# Patient Record
Sex: Female | Born: 1975 | Race: White | Hispanic: No | Marital: Married | Smoking: Former smoker
Health system: Southern US, Community
[De-identification: ages and names within clinical notes are randomized; demographics above are authoritative.]

## PROBLEM LIST (undated history)

## (undated) DIAGNOSIS — R011 Cardiac murmur, unspecified: Secondary | ICD-10-CM

## (undated) DIAGNOSIS — I341 Nonrheumatic mitral (valve) prolapse: Secondary | ICD-10-CM

## (undated) DIAGNOSIS — G8220 Paraplegia, unspecified: Secondary | ICD-10-CM

## (undated) DIAGNOSIS — F419 Anxiety disorder, unspecified: Secondary | ICD-10-CM

## (undated) DIAGNOSIS — I350 Nonrheumatic aortic (valve) stenosis: Secondary | ICD-10-CM

## (undated) HISTORY — PX: TUBAL LIGATION: SHX77

## (undated) HISTORY — PX: AORTA SURGERY: SHX548

---

## 2009-03-19 ENCOUNTER — Emergency Department (HOSPITAL_BASED_OUTPATIENT_CLINIC_OR_DEPARTMENT_OTHER): Admission: EM | Admit: 2009-03-19 | Discharge: 2009-03-19 | Payer: Self-pay | Admitting: Emergency Medicine

## 2009-03-19 ENCOUNTER — Ambulatory Visit: Payer: Self-pay | Admitting: Diagnostic Radiology

## 2009-12-12 ENCOUNTER — Ambulatory Visit: Payer: Self-pay | Admitting: Cardiovascular Disease

## 2009-12-20 ENCOUNTER — Encounter: Admission: RE | Admit: 2009-12-20 | Discharge: 2009-12-20 | Payer: Self-pay | Admitting: Cardiovascular Disease

## 2009-12-25 ENCOUNTER — Encounter: Payer: Self-pay | Admitting: Cardiovascular Disease

## 2009-12-25 ENCOUNTER — Ambulatory Visit: Payer: Self-pay | Admitting: Cardiovascular Disease

## 2009-12-25 ENCOUNTER — Ambulatory Visit (HOSPITAL_COMMUNITY): Admission: RE | Admit: 2009-12-25 | Discharge: 2009-12-25 | Payer: Self-pay | Admitting: Cardiovascular Disease

## 2010-07-01 LAB — DIFFERENTIAL
Basophils Absolute: 0 10*3/uL (ref 0.0–0.1)
Basophils Relative: 1 % (ref 0–1)
Lymphocytes Relative: 21 % (ref 12–46)
Monocytes Absolute: 0.3 10*3/uL (ref 0.1–1.0)
Monocytes Relative: 6 % (ref 3–12)
Neutro Abs: 3.5 10*3/uL (ref 1.7–7.7)
Neutrophils Relative %: 72 % (ref 43–77)

## 2010-07-01 LAB — COMPREHENSIVE METABOLIC PANEL
Albumin: 4.6 g/dL (ref 3.5–5.2)
Alkaline Phosphatase: 57 U/L (ref 39–117)
BUN: 8 mg/dL (ref 6–23)
Creatinine, Ser: 0.4 mg/dL (ref 0.4–1.2)
Glucose, Bld: 93 mg/dL (ref 70–99)
Total Protein: 7.5 g/dL (ref 6.0–8.3)

## 2010-07-01 LAB — URINALYSIS, ROUTINE W REFLEX MICROSCOPIC
Glucose, UA: NEGATIVE mg/dL
Ketones, ur: NEGATIVE mg/dL
Nitrite: NEGATIVE
Protein, ur: NEGATIVE mg/dL
pH: 7.5 (ref 5.0–8.0)

## 2010-07-01 LAB — CBC
HCT: 37.4 % (ref 36.0–46.0)
Hemoglobin: 13.1 g/dL (ref 12.0–15.0)
MCHC: 35.1 g/dL (ref 30.0–36.0)
MCV: 94.3 fL (ref 78.0–100.0)
Platelets: 197 10*3/uL (ref 150–400)
RDW: 12.4 % (ref 11.5–15.5)

## 2010-07-01 LAB — PREGNANCY, URINE: Preg Test, Ur: NEGATIVE

## 2010-12-21 IMAGING — CT CT ANGIO CHEST
2 of 6 series · 10 of 30 positions shown · IV contrast (agent unspecified)
Comparison: Chest x-ray of 03/19/2009

CTA CHEST

CLINICAL DATA: History of aortic valve surgery, now with chest
pain and upper mid abdominal pain

CT ANGIOGRAPHY CHEST AND ABDOMEN
TECHNIQUE: Multidetector CT imaging of the chest and abdomen was
performed using the standard protocol during bolus administration
of intravenous contrast.  Multiplanar CT image reconstructions
including MIPs were obtained to evaluate the vascular anatomy.
Contrast: 100 ml Umnipaque-URR

[Series 5: angio · axial · 0.70mm/px · z∈[-358,-28]mm · 6 of 186 slices shown]
[im 27/186  lung]
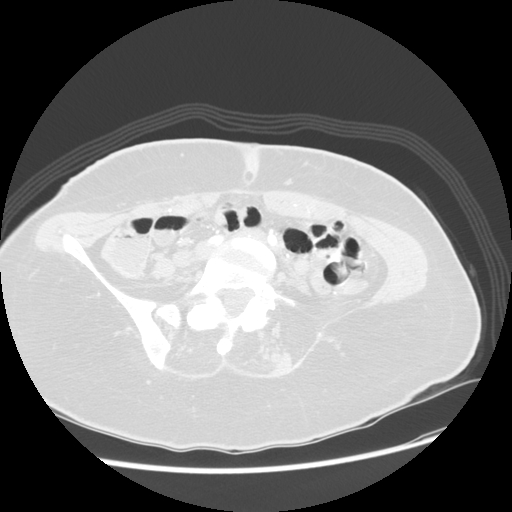
[im 53/186  mediastinal]
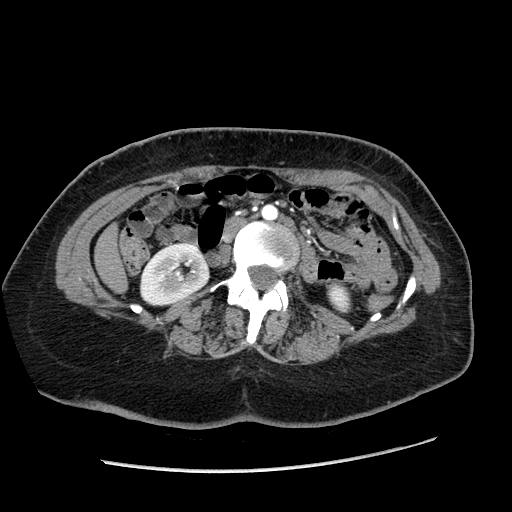
[im 80/186  lung]
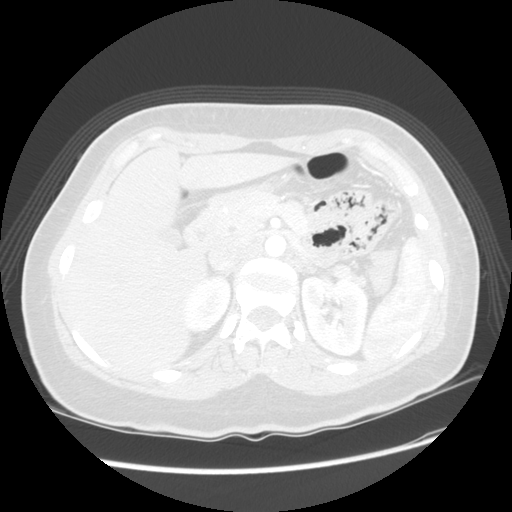
[im 106/186  mediastinal]
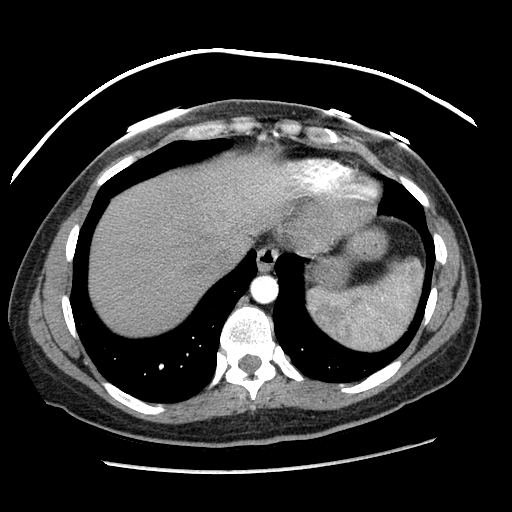
[im 133/186  lung]
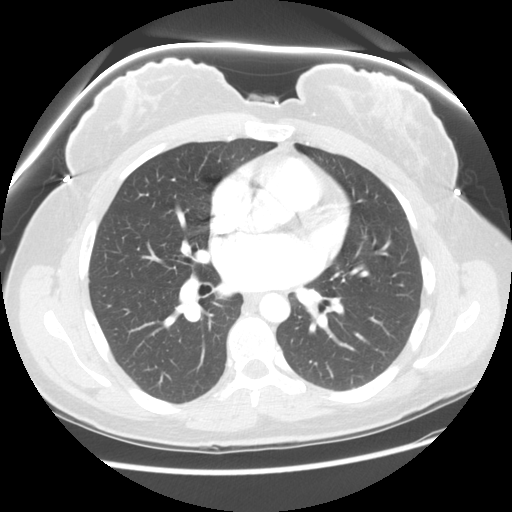
[im 159/186  mediastinal]
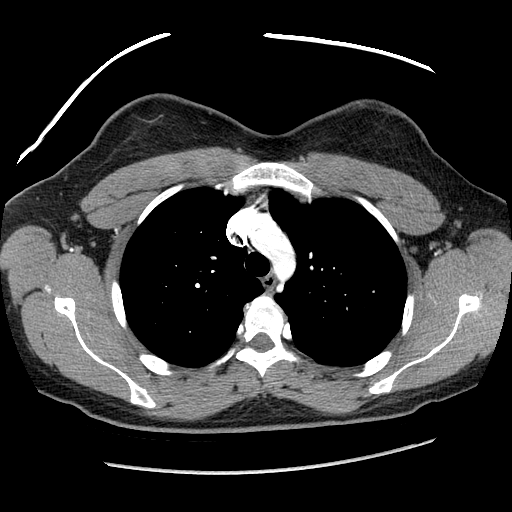

[Series 602: sagittal body · sagittal · 0.90mm/px · 4 of 145 slices shown]
[im 29/145  lung]
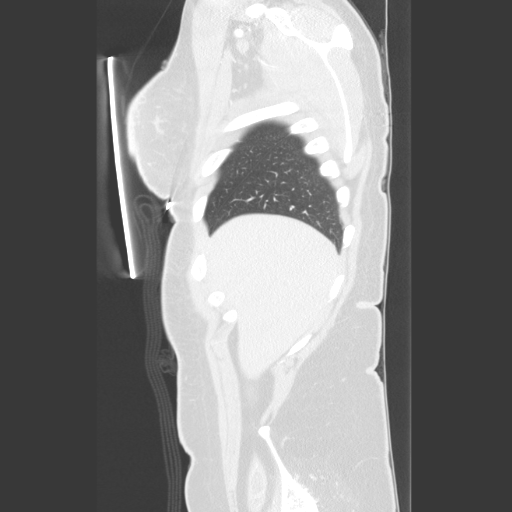
[im 58/145  lung]
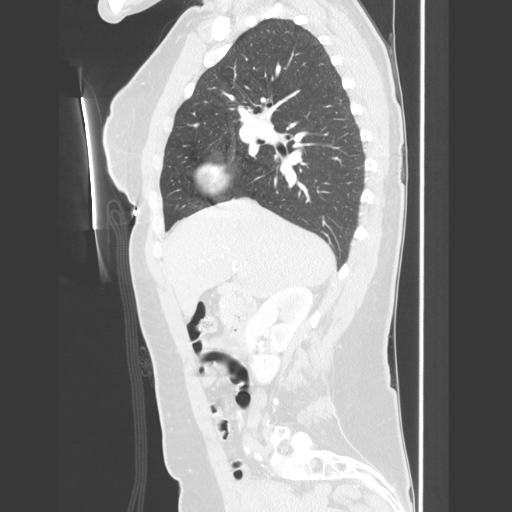
[im 87/145  lung]
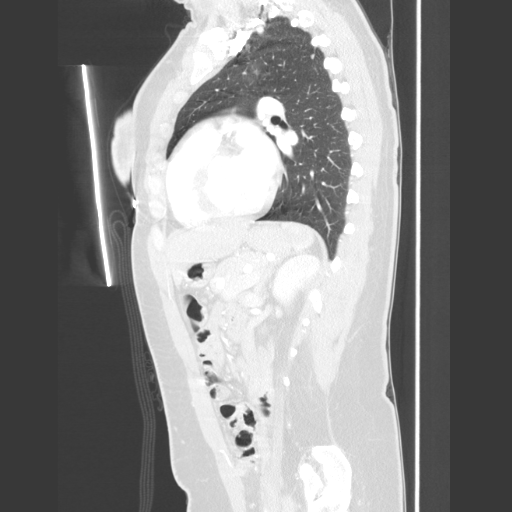
[im 116/145  lung]
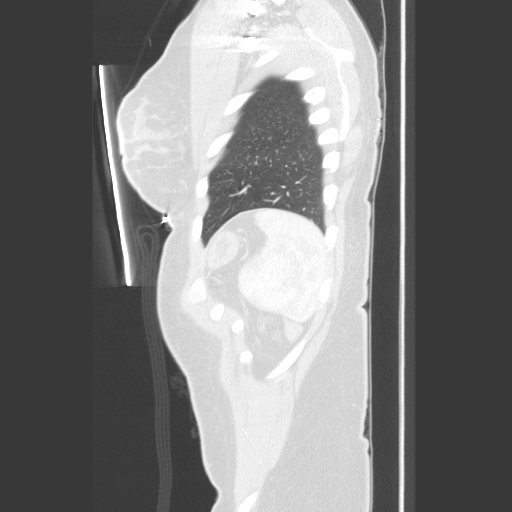

[10 of 30 positions shown; findings below may reference images not displayed]

FINDINGS: The pulmonary arteries opacify well and there is no
evidence of acute pulmonary embolism.  The thoracic aorta opacifies
with no acute abnormality noted.  No mediastinal or hilar
adenopathy is seen.  The heart is mildly enlarged.  The origins of
the great vessels appear widely patent.

On the lung window images, no suspicious lung lesion is seen.  No
infiltrate is noted.  There is no evidence of pleural effusion.
There is thoracolumbar scoliosis present.

 Review of the MIP images confirms the above findings.
IMPRESSION: 1.  No evidence of acute pulmonary embolism.
2.  No acute abnormality of the thoracic aorta.
3.  Cardiomegaly.

CTA ABDOMEN
FINDINGS: The abdominal aorta opacifies and is normal in caliber.
There is no evidence of dissection.  The origins of the celiac
axis, SMA, renal arteries and IMA are all patent and well
visualized.

The liver enhances with no focal abnormality noted.  No calcified
gallstones are seen.  The pancreas is normal in size and the
pancreatic duct is not dilated.  The adrenal glands and spleen are
unremarkable.  The stomach is not well distended.  The kidneys
enhance with no calculus or mass and no hydronephrosis is seen.  No
adenopathy is noted.  No bony abnormality seen other than a
thoracolumbar scoliosis.  There is some free fluid layering within
the pelvis on the lower images obtained which may be due to
ruptured ovarian cyst.

 Review of the MIP images confirms the above findings.
IMPRESSION: 1.  No acute abnormality of the abdominal aorta which is normal in
caliber.
2.  Some free fluid layers in the pelvis on the few images obtained
on the CT abdomen.  Question ruptured ovarian cyst?

## 2011-02-01 ENCOUNTER — Emergency Department (HOSPITAL_BASED_OUTPATIENT_CLINIC_OR_DEPARTMENT_OTHER)
Admission: EM | Admit: 2011-02-01 | Discharge: 2011-02-02 | Disposition: A | Discharge status: Home / Self Care | Payer: BC Managed Care – PPO | Attending: Emergency Medicine | Admitting: Emergency Medicine

## 2011-02-01 ENCOUNTER — Encounter: Payer: Self-pay | Admitting: *Deleted

## 2011-02-01 DIAGNOSIS — M545 Low back pain, unspecified: Secondary | ICD-10-CM | POA: Insufficient documentation

## 2011-02-01 DIAGNOSIS — N390 Urinary tract infection, site not specified: Secondary | ICD-10-CM | POA: Insufficient documentation

## 2011-02-01 DIAGNOSIS — N83209 Unspecified ovarian cyst, unspecified side: Secondary | ICD-10-CM | POA: Insufficient documentation

## 2011-02-01 DIAGNOSIS — R109 Unspecified abdominal pain: Secondary | ICD-10-CM | POA: Insufficient documentation

## 2011-02-01 HISTORY — DX: Nonrheumatic aortic (valve) stenosis: I35.0

## 2011-02-01 HISTORY — DX: Cardiac murmur, unspecified: R01.1

## 2011-02-01 HISTORY — DX: Nonrheumatic mitral (valve) prolapse: I34.1

## 2011-02-01 LAB — URINALYSIS, ROUTINE W REFLEX MICROSCOPIC
Bilirubin Urine: NEGATIVE
Ketones, ur: NEGATIVE mg/dL
Nitrite: POSITIVE — AB
Protein, ur: 30 mg/dL — AB
Urobilinogen, UA: 0.2 mg/dL (ref 0.0–1.0)

## 2011-02-01 NOTE — ED Notes (Signed)
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

## 2011-02-02 ENCOUNTER — Emergency Department (INDEPENDENT_AMBULATORY_CARE_PROVIDER_SITE_OTHER): Payer: BC Managed Care – PPO

## 2011-02-02 DIAGNOSIS — R109 Unspecified abdominal pain: Secondary | ICD-10-CM

## 2011-02-02 DIAGNOSIS — M6250 Muscle wasting and atrophy, not elsewhere classified, unspecified site: Secondary | ICD-10-CM

## 2011-02-02 DIAGNOSIS — N83209 Unspecified ovarian cyst, unspecified side: Secondary | ICD-10-CM

## 2011-02-02 LAB — BASIC METABOLIC PANEL
BUN: 10 mg/dL (ref 6–23)
CO2: 24 mEq/L (ref 19–32)
Calcium: 9.5 mg/dL (ref 8.4–10.5)
Glucose, Bld: 108 mg/dL — ABNORMAL HIGH (ref 70–99)
Sodium: 138 mEq/L (ref 135–145)

## 2011-02-02 LAB — DIFFERENTIAL
Eosinophils Relative: 0 % (ref 0–5)
Lymphocytes Relative: 9 % — ABNORMAL LOW (ref 12–46)
Lymphs Abs: 0.6 10*3/uL — ABNORMAL LOW (ref 0.7–4.0)
Monocytes Absolute: 0.3 10*3/uL (ref 0.1–1.0)
Monocytes Relative: 5 % (ref 3–12)

## 2011-02-02 LAB — CBC
HCT: 36 % (ref 36.0–46.0)
Hemoglobin: 12.4 g/dL (ref 12.0–15.0)
MCH: 31.2 pg (ref 26.0–34.0)
MCV: 90.7 fL (ref 78.0–100.0)
Platelets: 165 10*3/uL (ref 150–400)
RBC: 3.97 MIL/uL (ref 3.87–5.11)
WBC: 7.4 10*3/uL (ref 4.0–10.5)

## 2011-02-02 MED ORDER — DEXTROSE 5 % IV SOLN
1.0000 g | Freq: Once | INTRAVENOUS | Status: DC
  Administered 2011-02-02: 1 g via INTRAVENOUS

## 2011-02-02 MED ORDER — KETOROLAC TROMETHAMINE 30 MG/ML IJ SOLN
INTRAMUSCULAR | Status: AC
  Filled 2011-02-02: qty 1

## 2011-02-02 MED ORDER — SODIUM CHLORIDE 0.9 % IV SOLN
Freq: Once | INTRAVENOUS | Status: AC
  Administered 2011-02-02: 03:00:00 via INTRAVENOUS

## 2011-02-02 MED ORDER — DEXTROSE 5 % IV SOLN
INTRAVENOUS | Status: AC
  Filled 2011-02-02: qty 10

## 2011-02-02 MED ORDER — CIPROFLOXACIN HCL 500 MG PO TABS: 500.0000 mg | ORAL_TABLET | Freq: Two times a day (BID) | ORAL | Status: AC

## 2011-02-02 MED ORDER — MORPHINE SULFATE 4 MG/ML IJ SOLN
INTRAMUSCULAR | Status: AC
  Filled 2011-02-02: qty 1

## 2011-02-02 MED ORDER — KETOROLAC TROMETHAMINE 30 MG/ML IJ SOLN
30.0000 mg | Freq: Once | INTRAMUSCULAR | Status: AC
  Administered 2011-02-02: 30 mg via INTRAVENOUS

## 2011-02-02 MED ORDER — HYDROCODONE-ACETAMINOPHEN 5-325 MG PO TABS: 1.0000 | ORAL_TABLET | ORAL | Status: AC | PRN

## 2011-02-02 MED ORDER — MORPHINE SULFATE 4 MG/ML IJ SOLN
4.0000 mg | Freq: Once | INTRAMUSCULAR | Status: AC
  Administered 2011-02-02: 4 mg via INTRAVENOUS

## 2011-02-02 MED ORDER — SODIUM CHLORIDE 0.9 % IV BOLUS (SEPSIS)
1000.0000 mL | Freq: Once | INTRAVENOUS | Status: AC
  Administered 2011-02-02: 1000 mL via INTRAVENOUS

## 2011-02-02 MED ORDER — MORPHINE SULFATE 4 MG/ML IJ SOLN
4.0000 mg | Freq: Once | INTRAMUSCULAR | Status: AC
  Administered 2011-02-02: 4 mg via INTRAVENOUS
  Filled 2011-02-02: qty 1

## 2011-02-02 NOTE — ED Provider Notes (Addendum)
Scribed for Forbes Cellar, MD, the patient was seen in room MH08/MH08. This chart was scribed by [employer]. The patient's care started at 23:54  CSN: [id number] Arrival date & time: 02/01/2011 11:53 PM   First MD Initiated Contact with Patient 02/01/11 2354      Chief Complaint  Patient presents with  . Flank Pain   HPI Stacey Chaney is a 35 y.o. female who presents to the Emergency Department complaining of abdominal pain/flank pain for three days. She reports low back pain constant but intermittently worsening, with associated low abdominal pain and pelvic pain for three days. Patient denies any constipation or diarrhea. Denies nausea, no vomiting. C.o diffuse lower back pain. Denies hematuria/dysuria/freq/urgency. Denies vaginal discharge. She is sexually active with her husband.  Patient is non ambulatory secondary to nerve damage during surgery for surgery on her aorta (?spinal cord ischemia?). States that she voids independently and does not self cath. No ulcer.    Joss Stoney Bang, RN 02/01/2011 22:08    Pt reports low back pain, low abd pain and pelvic pain x 3 days- pt reports she had nerve damage during surgery for aortic stenosis and has minimal movement to lower extremities- states has full sensation and voids independently     Past Medical History  Diagnosis Date  . Aortic stenosis   . Mitral valve prolapse   . Heart murmur     Past Surgical History  Procedure Date  . Tubal ligation   . Cesarean section   . Aorta surgery     No family history on file.  History  Substance Use Topics  . Smoking status: Former Games developer  . Smokeless tobacco: Never Used  . Alcohol Use: 0.6 oz/week    1 Glasses of wine per week    OB History    Grav Para Term Preterm Abortions TAB SAB Ect Mult Living                  Review of Systems  Constitutional: Negative for fever and unexpected weight change.  HENT: Negative for ear pain and sore throat.   Eyes: Negative for visual  disturbance.  Respiratory: Negative for cough.   Cardiovascular: Negative for chest pain.  Gastrointestinal: Negative for vomiting and diarrhea.  Genitourinary: Negative.   Musculoskeletal: Negative for myalgias.  Skin: Negative for rash.  Neurological: Negative for dizziness, seizures and syncope.  Hematological: Negative.   Psychiatric/Behavioral: Negative.     Allergies  Rocephin  Home Medications   Current Outpatient Rx  Name Route Sig Dispense Refill  . ACETAMINOPHEN 500 MG PO TABS Oral Take 500 mg by mouth every 6 (six) hours as needed. For pain     . THERA M PLUS PO TABS Oral Take 1 tablet by mouth daily.      Marland Kitchen FISH OIL PO Oral Take 2 capsules by mouth daily.      Marland Kitchen CIPROFLOXACIN HCL 500 MG PO TABS Oral Take 1 tablet (500 mg total) by mouth every 12 (twelve) hours. 14 tablet 0  . HYDROCODONE-ACETAMINOPHEN 5-325 MG PO TABS Oral Take 1 tablet by mouth every 4 (four) hours as needed for pain. 20 tablet 0    BP 107/45  Pulse 96  Temp(Src) 98.8 F (37.1 C) (Oral)  Resp 20  Ht 5\' 2"  (1.575 m)  Wt 135 lb (61.236 kg)  BMI 24.69 kg/m2  SpO2 100%  Physical Exam  Nursing note and vitals reviewed. Constitutional: She is oriented to person, place, and time. She appears  well-developed and well-nourished. No distress.       Awake, alert, nontoxic appearance with baseline speech.  HENT:  Head: Normocephalic and atraumatic.  Mouth/Throat: Oropharynx is clear and moist. No oropharyngeal exudate.  Eyes: Conjunctivae and EOM are normal. Pupils are equal, round, and reactive to light. Right eye exhibits no discharge. Left eye exhibits no discharge. No scleral icterus.  Neck: Neck supple. No thyromegaly present.  Cardiovascular: Normal rate and regular rhythm.  Exam reveals no gallop and no friction rub.   No murmur heard. Pulmonary/Chest: Effort normal and breath sounds normal. No stridor. No respiratory distress. She has no wheezes. She has no rales. She exhibits no tenderness.    Abdominal: Soft. Normal appearance and bowel sounds are normal. She exhibits no distension and no mass. There is tenderness in the right lower quadrant, epigastric area and suprapubic area. There is CVA tenderness. There is no rebound.       Lt CVAT  Genitourinary: No vaginal discharge found.       No CMT +R and L adnexal ttp  Musculoskeletal: She exhibits no edema and no tenderness.  Lymphadenopathy:    She has no cervical adenopathy.  Neurological: She is alert and oriented to person, place, and time. Coordination normal.       Mental status baseline for patient.  Upper extremity motor strength and sensation intact and symmetric bilaterally.  Skin: Skin is warm and dry. No rash noted. She is not diaphoretic. No erythema.  Psychiatric: She has a normal mood and affect. Her behavior is normal.    ED Course  Procedures   DIAGNOSTIC STUDIES: Oxygen Saturation is 100% on room air,normal by my interpretation.    COORDINATION OF CARE: 00:20 - EDP examined patient and ordered the following Orders Placed This Encounter  Procedures  . Urine culture  . CT Abdomen Pelvis Wo Contrast  . US Transvaginal Non-OB  . US Pelvis Complete  . [place] Art/Ven Flow Abd Pelv Doppler  . Urinalysis with microscopic  . Pregnancy, urine  . Urine microscopic-add on  . CBC  . Differential  . Basic metabolic panel  . Re-check Vital Signs  . Re-check Vital Signs     Results for orders placed during the hospital encounter of 02/01/11  URINALYSIS, ROUTINE W REFLEX MICROSCOPIC      Component Value Range   Color, Urine YELLOW  YELLOW    Appearance TURBID (*) CLEAR    Specific Gravity, Urine 1.011  1.005 - 1.030    pH 5.5  5.0 - 8.0    Glucose, UA NEGATIVE  NEGATIVE (mg/dL)   Hgb urine dipstick MODERATE (*) NEGATIVE    Bilirubin Urine NEGATIVE  NEGATIVE    Ketones, ur NEGATIVE  NEGATIVE (mg/dL)   Protein, ur 30 (*) NEGATIVE (mg/dL)   Urobilinogen, UA 0.2  0.0 - 1.0 (mg/dL)   Nitrite POSITIVE (*)  NEGATIVE    Leukocytes, UA LARGE (*) NEGATIVE   PREGNANCY, URINE      Component Value Range   Preg Test, Ur NEGATIVE    URINE MICROSCOPIC-ADD ON      Component Value Range   Squamous Epithelial / LPF MANY (*) RARE    WBC, UA TOO NUMEROUS TO COUNT  <3 (WBC/hpf)   RBC / HPF 11-20  <3 (RBC/hpf)   Bacteria, UA MANY (*) RARE   CBC      Component Value Range   WBC 7.4  4.0 - 10.5 (K/uL)   RBC 3.97  3.87 - 5.11 (MIL/uL)  Hemoglobin 12.4  12.0 - 15.0 (g/dL)   HCT 57.8  46.9 - 62.9 (%)   MCV 90.7  78.0 - 100.0 (fL)   MCH 31.2  26.0 - 34.0 (pg)   MCHC 34.4  30.0 - 36.0 (g/dL)   RDW 52.8  41.3 - 24.4 (%)   Platelets 165  150 - 400 (K/uL)  DIFFERENTIAL      Component Value Range   Neutrophils Relative 86 (*) 43 - 77 (%)   Neutro Abs 6.4  1.7 - 7.7 (K/uL)   Lymphocytes Relative 9 (*) 12 - 46 (%)   Lymphs Abs 0.6 (*) 0.7 - 4.0 (K/uL)   Monocytes Relative 5  3 - 12 (%)   Monocytes Absolute 0.3  0.1 - 1.0 (K/uL)   Eosinophils Relative 0  0 - 5 (%)   Eosinophils Absolute 0.0  0.0 - 0.7 (K/uL)   Basophils Relative 0  0 - 1 (%)   Basophils Absolute 0.0  0.0 - 0.1 (K/uL)  BASIC METABOLIC PANEL      Component Value Range   Sodium 138  135 - 145 (mEq/L)   Potassium 3.6  3.5 - 5.1 (mEq/L)   Chloride 104  96 - 112 (mEq/L)   CO2 24  19 - 32 (mEq/L)   Glucose, Bld 108 (*) 70 - 99 (mg/dL)   BUN 10  6 - 23 (mg/dL)   Creatinine, Ser 0.10 (*) 0.50 - 1.10 (mg/dL)   Calcium 9.5  8.4 - 27.2 (mg/dL)   GFR calc non Af Amer >90  >90 (mL/min)   GFR calc Af Amer >90  >90 (mL/min)      MDM: abdominal pain, flank pain. Possible UTI with ?stone. CT A/P negative for nephrolithiasis but question of R adnexal mass v/s complex cyst, less likely TOA. Small amt of free fluid.  Rt ureteral dilation similar to previous study per urology. Will order Pelvic U/S to further evaluate in ED.  Pt given ceftriaxone IV and morphine (previously had 1 dose tonight without similar reaction) and began to feel pre-syncopal.  There was no swelling, rash, difficulty breathing. Infusion stopped immediately. Blood pressure noted to be low SBP 60-70s. IVF infusing and pt back to baseline blood pressure and feels better. Continues to have pain. Toradol ordered.   Scribe Attestation I personally performed the services described in this documentation, which was scribed in my presence. The recorded information has been reviewed and considered.   Stefano Gaul, MD    10:35 AM Pelvic ultrasound showed a ruptured right ovarian cyst, with some intra-abdominal hemorrhage. Patient is in a mild amount of pain. Review of her lab tests showed that she has a urinary infection as well. We discussed taking the antibiotic Cipro for the UTI, and Norco as needed for pain. She has a gynecologist to followup in the town of [place], [place].    Carleene Cooper III, MD 02/02/11 1031  Carleene Cooper III, MD 02/03/11 [phone]

## 2011-02-02 NOTE — ED Notes (Signed)
Pt c/o lightheaded "going to pass out"- EDP Webb at bedside to assess- Rocephin stopped- NS bolus wide open infusing- initial b/p 60/38 at 0249; 113/48 at 0254. Pt given ice chips per [facility] from [facility] for c/o feeling thirsty. Cardiac monitor shows NSR rate 90. )2 100% on RA. Will continue to monitor

## 2011-02-02 NOTE — ED Notes (Signed)
Pt moved to rm 8 for more privacy and comfort also placed on hospital bed resting comfortably NAD at present PO fluids given BP improved will continue to monitor

## 2011-02-04 LAB — URINE CULTURE
Colony Count: >=100000
Culture  Setup Time: 201211050121

## 2011-02-05 NOTE — ED Notes (Signed)
+   Urine Patient treated with Cipro-sensitive to same-chart appended per protocol MD. 

## 2012-12-27 ENCOUNTER — Other Ambulatory Visit: Payer: Self-pay

## 2012-12-27 ENCOUNTER — Encounter (HOSPITAL_BASED_OUTPATIENT_CLINIC_OR_DEPARTMENT_OTHER): Payer: Self-pay | Admitting: Emergency Medicine

## 2012-12-27 ENCOUNTER — Emergency Department (HOSPITAL_BASED_OUTPATIENT_CLINIC_OR_DEPARTMENT_OTHER)
Admission: EM | Admit: 2012-12-27 | Discharge: 2012-12-27 | Disposition: A | Discharge status: Home / Self Care | Payer: BC Managed Care – PPO | Attending: Emergency Medicine | Admitting: Emergency Medicine

## 2012-12-27 DIAGNOSIS — Z87891 Personal history of nicotine dependence: Secondary | ICD-10-CM | POA: Insufficient documentation

## 2012-12-27 DIAGNOSIS — Z8679 Personal history of other diseases of the circulatory system: Secondary | ICD-10-CM | POA: Insufficient documentation

## 2012-12-27 DIAGNOSIS — Z79899 Other long term (current) drug therapy: Secondary | ICD-10-CM | POA: Insufficient documentation

## 2012-12-27 DIAGNOSIS — R002 Palpitations: Secondary | ICD-10-CM | POA: Insufficient documentation

## 2012-12-27 DIAGNOSIS — R011 Cardiac murmur, unspecified: Secondary | ICD-10-CM | POA: Insufficient documentation

## 2012-12-27 DIAGNOSIS — Z8669 Personal history of other diseases of the nervous system and sense organs: Secondary | ICD-10-CM | POA: Insufficient documentation

## 2012-12-27 HISTORY — DX: Paraplegia, unspecified: G82.20

## 2012-12-27 LAB — BASIC METABOLIC PANEL
Calcium: 9.8 mg/dL (ref 8.4–10.5)
GFR calc Af Amer: >90 mL/min (ref >90)
GFR calc non Af Amer: >90 mL/min (ref >90)
Glucose, Bld: 106 mg/dL — ABNORMAL HIGH (ref 70–99)
Potassium: 3.5 mEq/L (ref 3.5–5.1)
Sodium: 138 mEq/L (ref 135–145)

## 2012-12-27 LAB — POCT I-STAT, CHEM 8
Creatinine, Ser: 0.6 mg/dL (ref 0.50–1.10)
HCT: 39 % (ref 36.0–46.0)
Hemoglobin: 13.3 g/dL (ref 12.0–15.0)
Sodium: 139 mEq/L (ref 135–145)
TCO2: 20 mmol/L (ref 0–100)

## 2012-12-27 LAB — CBC WITH DIFFERENTIAL/PLATELET
Basophils Absolute: 0 10*3/uL (ref 0.0–0.1)
Basophils Relative: 0 % (ref 0–1)
Eosinophils Absolute: 0.1 10*3/uL (ref 0.0–0.7)
Eosinophils Relative: 2 % (ref 0–5)
Lymphs Abs: 2.3 10*3/uL (ref 0.7–4.0)
MCH: 32.1 pg (ref 26.0–34.0)
MCHC: 34.3 g/dL (ref 30.0–36.0)
MCV: 93.6 fL (ref 78.0–100.0)
Neutrophils Relative %: 45 % (ref 43–77)
Platelets: 204 10*3/uL (ref 150–400)
RDW: 12.8 % (ref 11.5–15.5)

## 2012-12-27 MED ORDER — LORAZEPAM 2 MG/ML IJ SOLN
INTRAMUSCULAR | Status: AC
  Administered 2012-12-27: 1 mg
  Filled 2012-12-27: qty 1

## 2012-12-27 MED ORDER — POTASSIUM CHLORIDE CRYS ER 20 MEQ PO TBCR
40.0000 meq | EXTENDED_RELEASE_TABLET | Freq: Once | ORAL | Status: AC
  Administered 2012-12-27: 40 meq via ORAL
  Filled 2012-12-27: qty 2

## 2012-12-27 NOTE — ED Provider Notes (Addendum)
CSN: [id number]     Arrival date & time 12/27/12  0140 History   First MD Initiated Contact with Patient 12/27/12 0155     Chief Complaint  Patient presents with  . Palpitations   (Consider location/radiation/quality/duration/timing/severity/associated sxs/prior Treatment) HPI This is a 37 year old female who has had episodes of tachyarrhythmia since July. She is currently wearing a heart monitor to record these events. She has not been given a definitive diagnosis. She is here with a sudden onset of a rapid heartbeat that began about an hour ago. Her husband states the rate was in the 160s. There was no associated chest pain, nausea or diaphoresis but she did become very anxious and hyperventilated. On arrival she is very tremulous. She has not taken any medication to treat this. Her symptoms have improved and she does not feel the rate is fast as it was earlier.  Past Medical History  Diagnosis Date  . Aortic stenosis   . Mitral valve prolapse   . Heart murmur   . Paraplegia     since age 50   Past Surgical History  Procedure Laterality Date  . Tubal ligation    . Cesarean section    . Aorta surgery     History reviewed. No pertinent family history. History  Substance Use Topics  . Smoking status: Former Games developer  . Smokeless tobacco: Never Used  . Alcohol Use: 0.6 oz/week    1 Glasses of wine per week   OB History   Grav Para Term Preterm Abortions TAB SAB Ect Mult Living                 Review of Systems  All other systems reviewed and are negative.    Allergies  Rocephin  Home Medications   Current Outpatient Rx  Name  Route  Sig  Dispense  Refill  . acetaminophen (TYLENOL) 500 MG tablet   Oral   Take 500 mg by mouth every 6 (six) hours as needed. For pain          . Multiple Vitamins-Minerals (MULTIVITAMINS THER. W/MINERALS) TABS   Oral   Take 1 tablet by mouth daily.           . Omega-3 Fatty Acids (FISH OIL PO)   Oral   Take 2 capsules by mouth  daily.            BP 107/55  Pulse 84  Resp 20  SpO2 99%  Physical Exam General: Well-developed, well-nourished female in no acute distress; appearance consistent with age of record HENT: normocephalic; atraumatic Eyes: pupils equal, round and reactive to light; extraocular muscles intact Neck: supple Heart: regular rate and rhythm; sinus tachycardia on monitor Lungs: clear to auscultation bilaterally Abdomen: soft; nondistended; nontender; no masses or hepatosplenomegaly Extremities: No deformity; full range of motion; pulses normal Neurologic: Awake, alert and oriented; motor function intact in upper extremities and symmetric; paraplegia; no facial droop; tremulous Skin: Warm and dry Psychiatric: Anxious    ED Course  Procedures (including critical care time)  MDM   Nursing notes and vitals signs, including pulse oximetry, reviewed.  Summary of this visit's results, reviewed by myself:  Labs:  Results for orders placed during the hospital encounter of 12/27/12 (from the past 24 hour(s))  CBC WITH DIFFERENTIAL     Status: None   Collection Time    12/27/12  2:03 AM      Result Value Range   WBC 5.3  4.0 - 10.5 K/uL  RBC 4.05  3.87 - 5.11 MIL/uL   Hemoglobin 13.0  12.0 - 15.0 g/dL   HCT 16.1  09.6 - 04.5 %   MCV 93.6  78.0 - 100.0 fL   MCH 32.1  26.0 - 34.0 pg   MCHC 34.3  30.0 - 36.0 g/dL   RDW 40.9  81.1 - 91.4 %   Platelets 204  150 - 400 K/uL   Neutrophils Relative % 45  43 - 77 %   Neutro Abs 2.4  1.7 - 7.7 K/uL   Lymphocytes Relative 43  12 - 46 %   Lymphs Abs 2.3  0.7 - 4.0 K/uL   Monocytes Relative 10  3 - 12 %   Monocytes Absolute 0.5  0.1 - 1.0 K/uL   Eosinophils Relative 2  0 - 5 %   Eosinophils Absolute 0.1  0.0 - 0.7 K/uL   Basophils Relative 0  0 - 1 %   Basophils Absolute 0.0  0.0 - 0.1 K/uL  BASIC METABOLIC PANEL     Status: Abnormal (Preliminary result)   Collection Time    12/27/12  2:03 AM      Result Value Range   Sodium PENDING  135  - 145 mEq/L   Potassium PENDING  3.5 - 5.1 mEq/L   Chloride PENDING  96 - 112 mEq/L   CO2 20  19 - 32 mEq/L   Glucose, Bld 106 (*) 70 - 99 mg/dL   BUN 8  6 - 23 mg/dL   Creatinine, Ser 7.82 (*) 0.50 - 1.10 mg/dL   Calcium 9.8  8.4 - 95.6 mg/dL   GFR calc non Af Amer >90  >90 mL/min   GFR calc Af Amer >90  >90 mL/min  TROPONIN I     Status: None   Collection Time    12/27/12  2:03 AM      Result Value Range   Troponin I <0.30  <0.30 ng/mL  POCT I-STAT, CHEM 8     Status: Abnormal   Collection Time    12/27/12  3:08 AM      Result Value Range   Sodium 139  135 - 145 mEq/L   Potassium 3.3 (*) 3.5 - 5.1 mEq/L   Chloride 109  96 - 112 mEq/L   BUN 7  6 - 23 mg/dL   Creatinine, Ser 2.13  0.50 - 1.10 mg/dL   Glucose, Bld 90  70 - 99 mg/dL   Calcium, Ion 0.86  5.78 - 1.23 mmol/L   TCO2 20  0 - 100 mmol/L   Hemoglobin 13.3  12.0 - 15.0 g/dL   HCT 46.9  62.9 - 52.8 %     3:38 AM Normal sinus rhythm in the 80s. No longer tremulous after 1 mg of Ativan IV. Suspect SVT or paroxysmal atrial fibrillation that resolved prior to arrival.   EKG Interpretation:  Date & Time: 12/27/2012 1:51 AM  Rate: 106  Rhythm: sinus tachycardia  QRS Axis: normal  Intervals: normal  ST/T Wave abnormalities: normal  Conduction Disutrbances:none  Narrative Interpretation:   Old EKG Reviewed: Rate is faster      Hanley Seamen, MD 12/27/12 0310  Hanley Seamen, MD 12/27/12 [phone]

## 2012-12-27 NOTE — ED Notes (Signed)
Awaken from sleep from Left chest pain non radiating pt reports having had this in the past since July 2014 currently seeing [facility] and wearing heart monitor for same.

## 2013-11-13 ENCOUNTER — Emergency Department (HOSPITAL_BASED_OUTPATIENT_CLINIC_OR_DEPARTMENT_OTHER)
Admission: EM | Admit: 2013-11-13 | Discharge: 2013-11-13 | Disposition: A | Discharge status: Home / Self Care | Payer: BC Managed Care – PPO | Attending: Emergency Medicine | Admitting: Emergency Medicine

## 2013-11-13 ENCOUNTER — Encounter (HOSPITAL_BASED_OUTPATIENT_CLINIC_OR_DEPARTMENT_OTHER): Payer: Self-pay | Admitting: Emergency Medicine

## 2013-11-13 DIAGNOSIS — R079 Chest pain, unspecified: Secondary | ICD-10-CM | POA: Insufficient documentation

## 2013-11-13 DIAGNOSIS — R011 Cardiac murmur, unspecified: Secondary | ICD-10-CM | POA: Diagnosis not present

## 2013-11-13 DIAGNOSIS — G8220 Paraplegia, unspecified: Secondary | ICD-10-CM | POA: Diagnosis not present

## 2013-11-13 DIAGNOSIS — R55 Syncope and collapse: Secondary | ICD-10-CM | POA: Diagnosis not present

## 2013-11-13 DIAGNOSIS — R42 Dizziness and giddiness: Secondary | ICD-10-CM | POA: Diagnosis not present

## 2013-11-13 DIAGNOSIS — Z87891 Personal history of nicotine dependence: Secondary | ICD-10-CM | POA: Diagnosis not present

## 2013-11-13 DIAGNOSIS — Z79899 Other long term (current) drug therapy: Secondary | ICD-10-CM | POA: Insufficient documentation

## 2013-11-13 DIAGNOSIS — R002 Palpitations: Secondary | ICD-10-CM

## 2013-11-13 DIAGNOSIS — Z8659 Personal history of other mental and behavioral disorders: Secondary | ICD-10-CM | POA: Diagnosis not present

## 2013-11-13 DIAGNOSIS — Z993 Dependence on wheelchair: Secondary | ICD-10-CM | POA: Insufficient documentation

## 2013-11-13 HISTORY — DX: Anxiety disorder, unspecified: F41.9

## 2013-11-13 LAB — CBC WITH DIFFERENTIAL/PLATELET
BASOS ABS: 0 10*3/uL (ref 0.0–0.1)
Basophils Relative: 0 % (ref 0–1)
EOS PCT: 2 % (ref 0–5)
Eosinophils Absolute: 0.1 10*3/uL (ref 0.0–0.7)
HEMATOCRIT: 36.3 % (ref 36.0–46.0)
HEMOGLOBIN: 12.6 g/dL (ref 12.0–15.0)
LYMPHS PCT: 39 % (ref 12–46)
Lymphs Abs: 1.5 10*3/uL (ref 0.7–4.0)
MCH: 32.3 pg (ref 26.0–34.0)
MCHC: 34.7 g/dL (ref 30.0–36.0)
MCV: 93.1 fL (ref 78.0–100.0)
MONO ABS: 0.3 10*3/uL (ref 0.1–1.0)
MONOS PCT: 9 % (ref 3–12)
Neutro Abs: 1.9 10*3/uL (ref 1.7–7.7)
Neutrophils Relative %: 50 % (ref 43–77)
Platelets: 213 10*3/uL (ref 150–400)
RBC: 3.9 MIL/uL (ref 3.87–5.11)
RDW: 12.7 % (ref 11.5–15.5)
WBC: 3.8 10*3/uL — AB (ref 4.0–10.5)

## 2013-11-13 LAB — BASIC METABOLIC PANEL
Anion gap: 15 (ref 5–15)
BUN: 12 mg/dL (ref 6–23)
CO2: 22 mEq/L (ref 19–32)
Calcium: 10.5 mg/dL (ref 8.4–10.5)
Chloride: 104 mEq/L (ref 96–112)
Creatinine, Ser: 0.5 mg/dL (ref 0.50–1.10)
GFR calc Af Amer: >90 mL/min (ref >90)
GFR calc non Af Amer: >90 mL/min (ref >90)
Glucose, Bld: 111 mg/dL — ABNORMAL HIGH (ref 70–99)
Potassium: 3.4 mEq/L — ABNORMAL LOW (ref 3.7–5.3)
Sodium: 141 mEq/L (ref 137–147)

## 2013-11-13 LAB — TROPONIN I: Troponin I: <0.3 ng/mL (ref <0.30)

## 2013-11-13 MED ORDER — POTASSIUM CHLORIDE CRYS ER 20 MEQ PO TBCR
40.0000 meq | EXTENDED_RELEASE_TABLET | Freq: Once | ORAL | Status: AC
  Administered 2013-11-13: 40 meq via ORAL
  Filled 2013-11-13: qty 2

## 2013-11-13 NOTE — ED Notes (Signed)
Patient states this morning she began having palpitations. Patient denies pain at this time. Tearful and anxious in triage.

## 2013-11-13 NOTE — ED Provider Notes (Signed)
CSN: [id number]     Arrival date & time 11/13/13  1619 History  This chart was scribed for Doug Sou, MD by Julian Hy, ED Scribe. The patient was seen in [facility]. The patient's care was started at 7:13 PM.     Chief Complaint  Patient presents with  . Chest Pain   Patient is a 38 y.o. female presenting with chest pain. The history is provided by the patient. No language interpreter was used.  Chest Pain Pain radiates to:  Does not radiate Pain severity:  No pain Progression:  Improving Chronicity:  Chronic Associated symptoms: near-syncope and palpitations   Associated symptoms: no syncope   Risk factors: aortic disease    HPI Comments: Stacey Chaney is a 38 y.o. female who presents to the Emergency Department complaining of chronic, moderate palpitations onset 10 hours ago. Pt reports she woke up with palpitations this morning and states today she experienced palpiations more frequently than usual. Pt reports she has previously had her symptoms once every minute but it has since decreased to one every 5 minutes. She denies any chest pain . She checked her heart rate at the time it was 66 Pt reports associated light-headedness. Pt reports it feels like her heart drops or stops. Pt reports she is a former smoker and quit smoking several years ago. Pt denies illicit drug use. Pt reports she drinks about 1 glass of wine/week.  Pt reports she regularly sees Dr. Marcelle Overlie at [facility]. Pt states she will see him again in September. Pt saw him last summer for SVT.  Pt reports she has previously had an event monitor that showed she only had two episodes and that they were not dangerous. Pt reports Dr. Sarina Ill stated medication was not needed for her symptoms.  Pt reports she has a past medical history of mitral valve prolapse, anxiety, aortic stenosis and heart murmur. 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 Pt denies currently taking any medications.   PCP: Dr. Leonette Most at  [facility].  Past Medical History  Diagnosis Date  . Aortic stenosis   . Mitral valve prolapse   . Heart murmur   . Paraplegia     since age 89  . Anxiety    Past Surgical History  Procedure Laterality Date  . Tubal ligation    . Cesarean section    . Aorta surgery     No family history on file. History  Substance Use Topics  . Smoking status: Former Games developer  . Smokeless tobacco: Never Used  . Alcohol Use: 0.6 oz/week    1 Glasses of wine per week   OB History   Grav Para Term Preterm Abortions TAB SAB Ect Mult Living                 Review of Systems  Constitutional: Negative.   HENT: Negative.   Respiratory: Negative.   Cardiovascular: Positive for palpitations and near-syncope. Negative for syncope.  Gastrointestinal: Negative.   Musculoskeletal: Negative.   Skin: Negative.   Neurological: Positive for light-headedness.       Wheelchair-bound  Psychiatric/Behavioral: Negative.   All other systems reviewed and are negative.  Allergies  Chocolate and Rocephin  Home Medications   Prior to Admission medications   Medication Sig Start Date End Date Taking? Authorizing Provider  acetaminophen (TYLENOL) 500 MG tablet Take 500 mg by mouth every 6 (six) hours as needed. For pain     Historical Provider, MD  Multiple Vitamins-Minerals (MULTIVITAMINS THER.  W/MINERALS) TABS Take 1 tablet by mouth daily.      Historical Provider, MD  Omega-3 Fatty Acids (FISH OIL PO) Take 2 capsules by mouth daily.      Historical Provider, MD   Triage Vitals: BP 114/68  Pulse 75  Temp(Src) 98.9 F (37.2 C) (Oral)  Resp 18  SpO2 100% Physical Exam  Nursing note and vitals reviewed. Constitutional: She appears well-developed and well-nourished.  HENT:  Head: Normocephalic and atraumatic.  Eyes: Conjunctivae are normal. Pupils are equal, round, and reactive to light.  Neck: Neck supple. No tracheal deviation present. No thyromegaly present.  Cardiovascular: Normal rate and regular  rhythm.   Murmur heard. 2/6 systolic ejection murmur at left sternal border  Pulmonary/Chest: Effort normal and breath sounds normal.  Abdominal: Soft. Bowel sounds are normal. She exhibits no distension. There is no tenderness.  Musculoskeletal: She exhibits no edema and no tenderness.  Paraplegic  Neurological: She is alert. Coordination normal.  Skin: Skin is warm and dry. No rash noted.  Psychiatric: She has a normal mood and affect.    ED Course  Procedures (including critical care time) DIAGNOSTIC STUDIES: Oxygen Saturation is 100% on RA, normal by my interpretation.    COORDINATION OF CARE: 7:24 PM- Patient informed of current plan for treatment and evaluation and agrees with plan at this time.  Labs Review Labs Reviewed  CBC WITH DIFFERENTIAL - Abnormal; Notable for the following:    WBC 3.8 (*)    All other components within normal limits  BASIC METABOLIC PANEL - Abnormal; Notable for the following:    Potassium 3.4 (*)    Glucose, Bld 111 (*)    All other components within normal limits  TROPONIN I    EKG Interpretation Date/Time:  Sunday November 13 2013 16:27:54 EDT Ventricular Rate:  82 PR Interval:  174 QRS Duration: 114 QT Interval:  416 QTC Calculation: 486 R Axis:   79 Text Interpretation:  Normal sinus rhythm Cannot rule out Inferior infarct, age undetermined Abnormal ECG No significant change since last tracing  Confirmed by Ethelda Chick  MD, Lilyahna Sirmon [phone]) on 11/13/2013 4:37:39 PM   Cardiac monitor showed normal sinus rhythm with an occasional PVC approximately one every 3 minutes while I was in the room, patient felt PVCs. Results for orders placed during the hospital encounter of 11/13/13  CBC WITH DIFFERENTIAL      Result Value Ref Range   WBC 3.8 (*) 4.0 - 10.5 K/uL   RBC 3.90  3.87 - 5.11 MIL/uL   Hemoglobin 12.6  12.0 - 15.0 g/dL   HCT 78.4  69.6 - 29.5 %   MCV 93.1  78.0 - 100.0 fL   MCH 32.3  26.0 - 34.0 pg   MCHC 34.7  30.0 - 36.0 g/dL   RDW  28.4  13.2 - 44.0 %   Platelets 213  150 - 400 K/uL   Neutrophils Relative % 50  43 - 77 %   Neutro Abs 1.9  1.7 - 7.7 K/uL   Lymphocytes Relative 39  12 - 46 %   Lymphs Abs 1.5  0.7 - 4.0 K/uL   Monocytes Relative 9  3 - 12 %   Monocytes Absolute 0.3  0.1 - 1.0 K/uL   Eosinophils Relative 2  0 - 5 %   Eosinophils Absolute 0.1  0.0 - 0.7 K/uL   Basophils Relative 0  0 - 1 %   Basophils Absolute 0.0  0.0 - 0.1 K/uL  BASIC METABOLIC  PANEL      Result Value Ref Range   Sodium 141  137 - 147 mEq/L   Potassium 3.4 (*) 3.7 - 5.3 mEq/L   Chloride 104  96 - 112 mEq/L   CO2 22  19 - 32 mEq/L   Glucose, Bld 111 (*) 70 - 99 mg/dL   BUN 12  6 - 23 mg/dL   Creatinine, Ser 4.09  0.50 - 1.10 mg/dL   Calcium 81.1  8.4 - 91.4 mg/dL   GFR calc non Af Amer >90  >90 mL/min   GFR calc Af Amer >90  >90 mL/min   Anion gap 15  5 - 15  TROPONIN I      Result Value Ref Range   Troponin I <0.30  <0.30 ng/mL   No results found.  MDM  Treatment Potassium chloride 40 mEq prior to discharge for mild hypokalemia. Patient is instructed to follow up with her cardiologist for symptomatic PVCs. I reassured her that this is not dangerous. Final diagnoses:  None   Diagnoses #1 palpitations #2 hypokalemia   I personally performed the services described in this documentation, which was scribed in my presence. The recorded information has been reviewed and considered.    Doug Sou, MD 11/13/13 [phone]

## 2013-11-13 NOTE — Discharge Instructions (Signed)
Premature Ventricular Contraction The heart monitor today show that you were having PVCs as the cause of your palpitations. Your blood potassium level was 3.4, slightly low. Contact your cardiologist to discuss treatment options. One option may be no further treatment necessary. He may want to place another event monitor on you to monitor your heart beat  Premature ventricular contraction (PVC) is an irregularity of the heart rhythm involving extra or skipped heartbeats. In some cases, they may occur without obvious cause or heart disease. Other times, they can be caused by an electrolyte change in the blood. These need to be corrected. They can also be seen when there is not enough oxygen going to the heart. A common cause of this is plaque or cholesterol buildup. This buildup decreases the blood supply to the heart. In addition, extra beats may be caused or aggravated by:  Excessive smoking.  Alcohol consumption.  Caffeine.  Certain medications  Some street drugs. SYMPTOMS   The sensation of feeling your heart skipping a beat (palpitations).  In many cases, the person may have no symptoms. SIGNS AND TESTS   A physical examination may show an occasional irregularity, but if the PVC beats do not happen often, they may not be found on physical exam.  Blood pressure is usually normal.  Other tests that may find extra beats of the heart are:  An EKG (electrocardiogram)  A Holter monitor which can monitor your heart over longer periods of time  An Angiogram (study of the heart arteries). TREATMENT  Usually extra heartbeats do not need treatment. The condition is treated only if symptoms are severe or if extra beats are very frequent or are causing problems. An underlying cause, if discovered, may also require treatment.  Treatment may also be needed if there may be a risk for other more serious cardiac arrhythmias.  PREVENTION   Moderation in caffeine, alcohol, and tobacco use may  reduce the risk of ectopic heartbeats in some people.  Exercise often helps people who lead a sedentary (inactive) lifestyle. PROGNOSIS  PVC heartbeats are generally harmless and do not need treatment.  RISKS AND COMPLICATIONS   Ventricular tachycardia (occasionally).  There usually are no complications.  Other arrhythmias (occasionally). SEEK IMMEDIATE MEDICAL CARE IF:   You feel palpitations that are frequent or continual.  You develop chest pain or other problems such as shortness of breath, sweating, or nausea and vomiting.  You become light-headed or faint (pass out).  You get worse or do not improve with treatment. Document Released: 11/02/2003 Document Revised: 06/09/2011 Document Reviewed: 05/14/2007 [facility] Patient Information 2015 [place], [place]. This information is not intended to replace advice given to you by your health care provider. Make sure you discuss any questions you have with your health care provider.

## 2015-07-20 NOTE — Unmapped External Note (Signed)
 General Outpatient Physical Therapy Evaluation  Payor: BCBS MANAGED CARE / Plan: BCBS BLUE OPTIONS PPO / Product Type: PPO /   PT Visit Count: 1  Referring Diagnosis:  1. Chronic neck and back pain  Ambulatory Referral to Physical Therapy  2. Paraplegia [facility])  Ambulatory Referral to Physical Therapy  3. Chronic pain of both shoulders  Ambulatory Referral to Physical Therapy  4. Persistent headaches     Date of Onset: Patient reports a chronic history of cervical, thoracic, bilateral shoulder and lumbar pain, but notes that her pain has gradually worsened over the past few years.  Referring Clinician:  Millicent Ozell Rush, MD  Referring Service/Team:  Family Medicine  Demographics:  Age: 40 y.o.  Gender: female  History of Present Illness:  Stacey Chaney is a pleasant 40 year old right hand dominant female who presents with a chief complaint of cervical, thoracic, bilateral shoulder and lumbar pain. She also complaints that she has been experiencing muscle cramping along her neck and shoulders. The patient reports that she has been experiencing headaches as well. Stacey Chaney reports that the headaches radiate towards the posterior head. She complains of muscular pain throughout her neck, shoulders and back. The patient reports that she has received epidural steroid injections in the past for pain, which provided pain relief. Stacey Chaney notes a history of upper scoliosis. She also reports that she sustained nerve damage during a surgery when she was 40 years old that resulted in paraplegia. The patient states that she has intact sensation in her lower extremities. She reports that she has minimal strength and movement of the left lower extremity, but no muscle activation in the right lower extremity. Stacey Chaney complains of weakness in her core and back. Significant Past Medical History:  No past medical history on file. Paraplegia, heart murmur, aortic stenosis repair, scoliosis Concurrent  Services:  None Therapy within Past Year:  no  Medications:   Current Outpatient Prescriptions:  .  multivit w/iron-minerals (THERA M PLUS, FERROUS FUMARAT,) 9 mg iron-400 mcg Tab tablet, Take 1 tablet by mouth., Disp: , Rfl:  .  omega 3-dha-epa-fish oil (FISH OIL) 60-90-500 mg Cap, Take 2 capsules by mouth., Disp: , Rfl:  Allergies:   Allergies as of 07/18/2015 - Review Complete 07/12/2015  Allergen Reaction Noted  . Ceftriaxone  Other (See Comments) 02/02/2011  . Chocolate flavor Vomiting (intolerance) 07/12/2015    Rehabilitation Precautions/Restrictions:   Precautions/Restrictions Precautions: Patient reports history of aortic stenosis repair, heart murmur, scoliosis, paraplegia   SUBJECTIVE Stacey Chaney complains of cervical, thoracic, bilateral shoulder and lumbar pain. She states that prolonged sitting in her wheelchair and propelling her wheelchair with her upper extremities has most likely contributed to her pain. The patient complains of significant tightness throughout her neck, mid-back, bilateral shoulders, low back and bilateral hip regions. Stacey Chaney also describes her pain as achy in nature. She complains of headaches as well. The patient reports that she experiences muscle cramping and a locking sensation in her neck at times. Stacey Chaney also inquires about obtaining a new wheelchair. She states that she received her current wheelchair at least 8 years ago or more.   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 The patient notes that house hold chores are difficult at times due to pain. She reports that any activity that involves bending,  lifting, reaching, pushing, or pulling is challenging secondary to pain. Stacey Chaney states that ADLs such as  donning/doffing clothing, fixing her hair and getting ready are difficult at times due to pain.  Patient Goals:  Mrs. Will desires not to have the pain where it locks up in my neck.  Pain:  Pain Assessment Pain Assessment: No/denies pain Pain Score 1: 0-No pain Pain Location: Cervical, thoracic, bilateral shoulders, and lumbar regions Other Pain Comments: The patient rates her resting cervical, thoracic, bilateral shoulder, and lumbar pain at 0/10 and worst pain experiened at 8/10.  Pain History: Gradual Onset Pain Frequency/Duration: Intermittent Aggravating Factors: Prolonged sitting, Reaching, Bending, Lifting, Comment Aggravating Factors Comments: household chores, ADLs Relieving Factors: Rest, Heat Home Environment:  The patient reports that she lives with her husband and children in a ranch-style home. Equipment Owned:  Media planner Exercise History: The patient reports limited exercise history due to her cervical, thoracic, bilateral shoulder and lumbar pain.   OBJECTIVE  General Observation: Per PT observation, the patient is able to self-propel herself into the physical therapy examination room in a lightweight frame wheelchair. The patient presents with increased forward head protrusion, increased thoracic kyphosis, bilateral protracted shoulders, and posterior pelvic tilt. She demonstrates slow and guarded cervical, thoracic, bilateral shoulder and lumbar movement patterns. PT notes that the patient exhibits scoliosis during thoracic and lumbar flexion.    Posture:  Sitting Deviation  Forward Head, Scapular protraction, Scoliosis, Increased thoracic kyphosis and bilateral shoulder protraction  Active Range of Motion:     Cervical Flexion: 18 degrees Cervical Extension: 39 degrees Cervical Rotation:  Right 47 degrees; Left 49 degrees Shoulder AROM: WFL bilaterally  *Patient exhibits bilateral hip flexor contractures Right: remains in 32 degrees of flexion in the supine  position Left: remains in 29 degrees of flexion in the supine position  MOVEMENT TESTS:  Cervical Flexion: Increased complaint of tightness Cervical Extension: No affect Cervical Lateral Flexion: Increased complaint of tightness Cervical Rotation: Mild complaint Cervical Retraction: No affect Cervical Protraction: Increased complaint  Shoulder Flexion: Mild complaint bilaterally Shoulder Abduction: Mild complaint bilaterally Shoulder External Rotation: Mild complaint bilaterally Shoulder Internal Rotation: Mild complaint bilaterally Shoulder Horizontal Abduction/Adduction: Mild complaint bilaterally Thoracic Flexion: Increased complaint Thoracic Extension: No affect Thoracic Lateral Flexion: No affect Thoracic Rotation: No affect Lumbar Flexion:  Increased complaint Lumbar Extension: No affect Lumbar Lateral Flexion: Mild complaint of tightness Lumbar Rotation: Mild complaint of tightnes  Strength:   Manual muscle testing was performed in order to assess the patient's strength. The following grades were assigned: *PT notes that the patient reports an increase in complaint during resisted muscle testing of bilateral upper extremities. Shoulder Flexion: Right 4+/5; Left 4+/5 Shoulder Abduction: Right 4+/5; Left 4+/5 Shoulder External Rotation: Right 4+/5; Left 4+/5 Shoulder Internal Rotation: Right 5/5; Left 5/5 Elbow Flexion: Right 5/5; Left 5/5 Elbow Extension:  Right 5/5; Left 5/5  Sensation:  Intact Functional Mobility:    Locomotion:    Patient is able to self-propel herself throughout the clinic in a lightweight frame wheelchair.     Special Tests:  Cervical Distraction Test: positive for pain relief Palpation: PT notes that the patient exhibits increased tenderness upon palpation of the cervical spine, thoracic spine, lumbar spine, bilateral upper trapezius muscles, bilateral levator scapulae muscles, bilateral sternocleidomastoid muscles, bilateral scapulothoracic  musculature, bilateral glenohumeral joints, bilateral rotator cuff musculature/tendons, bilateral deltoid muscles, bilateral quadratus lumborum muscles, and bilateral hip regions. Stacey Chaney exhibits increased tightness of bilateral upper trapezius muscles, bilateral levator scapulae  muscles, bilateral sternocleidomastoid muscles, bilateral pectoralis musculature, bilateral scapulothoracic musculature, bilateral hip flexors, and bilateral quadratus lumborum muscles.   ACCESSORY MOTION TESTING: PT notes that the patient reports a decrease in discomfort and tightness during and following passive muscle stretches of bilateral upper trapezius muscles, bilateral levator scapulae muscles, bilateral sternocleidomastoid muscles, bilateral pectoralis musculature, bilateral scapulothoracic musculature, bilateral hip flexors, and bilateral quadratus lumborum muscles. She notes a decrease in complaint during and following gentle manual cervical traction and gentle manual lumbar traction. The patient reports relief during and following soft tissue mobilization of bilateral cervical, scapulothoracic, and iliolumbar musculature. The patient notes no change in complaint during grade I and II central and unilateral posterior to anterior joint mobilizations of the cervical spine. Stacey Chaney reports reduced tightness following bilateral subscapular releases. She also notes relief in tightness during and following grade I and II anterior to posterior and posterior to anterior mobilizations of bilateral scapulae with the patient in the sidelying position.   Fall Risk Screen:  Fall Risk Screen Fall in the last year?: No An injury due to a fall?: No Feel unsteady or off balance?: No Gross Motor Coordination:  Grossly Intact - upper extremities  Outcomes:  NECK DISABILITY INDEX: The patient scored 10/50 on this outcome measure, which indicates mild disability. MODIFIED OSWESTRY LOW BACK PAIN DISABILITY QUESTIONNAIRE: The  patient scored 12/40 on this outcome measure. PT notes that 40/40 indicates complete disability. She is unable to complete the standing and walking sections due to paraplegia.  Interventions: Interventions Interventions performed on this visit:: Yes Therapeutic Exercises: Lumbar rotation/quadratus lumborum muscle stretch in supine 10 x 15/each, neutral spine exercise with an abdominal contraction and posterior pelvic tilt 10 x 10, chin retractions (seated/supine) x 10/each, scapular retractions x 10, levator scapulae muscle stretch 3 x 20/each Manual Therapy: PT provided manual cervical traction and suboccipital releases in order to reduce the patient's discomfort and tightness. Passive range of motion was provided into cervical flexion, cervical lateral flexion, bilateral cervical rotation, bilateral shoulder flexion, bilateral shoulder abduction, bilateral shoulder ER, bilateral shoulder IR, and lumbar rotation. PT provided passive muscle stretches to bilateral upper trapezius muscles, bilateral levator scapulae muscles, bilateral sternocleidomastoid muscles, bilateral pectoralis musculature, bilateral scapulothoracic musculature, bilateral quadratus lumborum muscles, and bilateral hip flexors. PT provided grade I and II bilateral first rib mobilizations. Grade I and II central and unilateral posterior to anterior joint mobilizations were provided to the cervical spine. PT provided gentle bilateral glenohumeral joint distraction and compression. Grade I and II anterior to posterior and posterior to anterior mobilizations were provided to bilateral scapulae with the patient in the sidelying position. Gentle bilateral subscapular releases were provided with the patient in the sidelying position. Soft tissue mobilization was provided along bilateral cervical, scapulothoracic, and iliolumbar musculature. Gentle manual lumbar traction was provided.   Today's treatment included a comprehensive physical therapy  evaluation, instruction and performance of an individualized home exercise program, and patient education regarding appropriate care for the neck, shoulders, and back. She was issued a written and pictorial HEP for increasing muscle flexibility, reducing pain, and providing cervical/scapulothoracic/bilateral glenohumeral joint/bilateral rotator cuff musculature and tendon/bilateral scapulohumeral/lumbar/core/pelvic stabilization and strength. The patient verbalized understanding of the HEP and PT goals.  Education:    Yes, provided as follows:   Barriers to Learning:  No Barriers   Learning Needs:  Treatment plan, Rehabilitation techniques and procedures, Pain Management and HEP, posture   Education Provided:  Per learning needs listed above   Audience:  Patient  Mode:  Explanation, Demonstration and Printed material provided   Interpreter Utilized: N/A   Response:  Applied knowledge, Verbalized understanding, Demonstrated skill and Needs practice   Modalities Skin Integrity Assessment:   Non Applicable  ASSESSMENT Physical findings are consistent with the referring physician's impression. Stacey Chaney is a good candidate for physical therapy and will make good progress towards all treatment goals. She would benefit from a new ultra lightweight wheelchair. The patient presents with the following impairments: 1. Increased cervical, bilateral shoulder, thoracic and lumbar pain 2. Decreased cervical/scapulothoracic/bilateral glenohumeral joint/bilateral rotator cuff musculature and tendon/bilateral scapulohumeral/lumbar/core/pelvic stabilization and strength 3. Decreased bilateral upper extremity strength 4. Increased tenderness of cervical, thoracic, bilateral shoulder and lumbar regions 5. Increased tightness of bilateral upper trapezius muscles, bilateral levator scapulae muscles, bilateral sternocleidomastoid muscles, bilateral pectoralis musculature, bilateral scapulothoracic musculature,  bilateral hip flexors, and bilateral quadratus lumborum muscles.  6. Decreased activity tolerance 7. Decreased muscle endurance 8. Impaired cervical, bilateral shoulder, thoracic and lumbar movement patterns 9. Impaired posture 10. Bilateral hip flexor contractures 11. Decreased active cervical range of motion 12. Impaired functional, school-related, and leisure-related activities   Therapy Diagnosis:   1. Chronic neck and back pain  Ambulatory Referral to Physical Therapy  2. Paraplegia [facility])  Ambulatory Referral to Physical Therapy  3. Chronic pain of both shoulders  Ambulatory Referral to Physical Therapy  4. Persistent headaches     Problem List: Problem List Problem List: Decreased ROM, Difficulty bending, Decreased strength, Difficulty lifting, Difficulty with prolonged sitting, Impaired ADLs, Muscle spasm, Pain  Equipment Needed:  Ultra lightweight wheelchair  Other Rehabilitation Considerations:  See Rehab Precautions  Response to Evaluation:  The session was tolerated well, as evidenced by patient reports reduced cervical, thoracic, bilateral shoulder and lumbar pain at the end of today's evaluation. She verbalized understanding and demonstrated the HEP.   Rehabilitation Potential:    Motivation/Commitment to Therapy:  Good  Rehabilitation Potential: Fair  Support Structure:  Good  Family member willing to assist  Goals:   Time frame to achieve goal:: 8 weeks Goal 1: Patient will be independent and compliant with progressive HEP. Goal 2: Patient will achieve the following pain-free active measurements: 30 degrees of cervical flexion, 50 degrees of cervical extension, 60 degrees of bilateral cervical rotation Goal 3: Patient will perform ADLs, school-related activities, and desired leisure activities with minimal limitation by cervical, thoracic, bilateral shoulder and lumbar complaints. Goal 4: Patient will report the ability to perform house hold tasks with 0/10 cervical,  thoracic, bilateral shoulder and lumbar pain. Goal 5: Patient will exhibit a minimum of a 10 degree decrease in bilateral hip flexor contractures. Goal 6: Patient will participate in therapeutic exercises in order to improve cervical/scapulothoracic/bilateral glenohumeral joint/bilateral rotator cuff musculature and tendon/bilateral scapulohumeral/lumbar/core/pelvic stabilization and strength, improve activity tolerance, improve muscle endurance, and achieve pain-free 5/5 MMT grades throughout bilateral upper extremities.  Goal 7: Patient will be able to perform all cervical, thoracic, bilateral shoulder and lumbar movement patterns with minimal limitations by complaint and minimal compensatory movement patterns.  Goal 8: The patient will score a maximum of 2/40 on the Modified Oswestry Low Back Pain Disability Questionnaire indicating reduced disability. Goal 9: The patient will score a maximum of 2/50 on the Neck Disability Index indicating the absence of disability. Goal 10: The patient will report a minimum of 80% improvement in cervical, thoracic, bilateral shoulder and lumbar complaints.    PLAN  Treatment Frequency, Duration, and Interventions:  Plan Treatment Frequency and duration : 2x/week  for 8 weeks Recommend PT procedures: Hot/Cold pack, Instruction - individual 30 min, Iontophoresis (Requires MD approval), Phonophoresis (requires MD approval), Self Care / Home Management, Physical Performance Test, Therapeutic Activity, Therapeutic Exercise, Ultrasound Necessity: Required to return to Premorbid environment (or reside in new living environment)?  no Required to reduce ADL or IADL assistance to Premorbid level?  yes  Recommended Consults:  Wheelchair vendor or wheelchair evaluation  Development of Plan of Care:  Patient participated in plan of care development today.  Total Treatment Time (Timed & Untimed):  65 minutes Total Time in Timed Codes:  28 minutes  The patient has been  instructed to contact our clinic if any questions or problems should arise.     Electronically signed by: Anette Fausto Roulette, PT 07/20/15 1601

## 2015-12-06 NOTE — Unmapped External Note (Signed)
 Physical Therapy Discharge Summary  Payor: BCBS MANAGED CARE / Plan: BCBS BLUE OPTIONS PPO / Product Type: PPO /    Referring Diagnosis: paraplegia    Referring Clinician:  Millicent Ozell Rush, MD Therapy Diagnosis:   1. Paraplegia ([facility])    2. Chronic midline thoracic back pain     Rehabilitation Precautions/Restrictions:   Precautions: uses wheelchair    Initial Evaluation Date:  09/17/15 Reporting Period:  09/17/15 to 12/06/15 Number of Visits to Date: PT Visit Count: 3  Number of Missed Visits:   0   SUBJECTIVE Pain:  Pain Assessment Reports: No/denies pain   OBJECTIVE  General Observation:  Arrived 15 minutes late.  Husband present.  Patient in [facility].  [employer] present.   Interventions:   Patient transferred into new MWC.  Adjustments complete for optimal fit/function.  Taught patient and husband adjustments and care of equipment.    Barthel Index: Feeding: Independent Bathing: Independent (or in shower) Grooming: Independent face/hair/teeth/shaving (implements provided) Dressing: Independent (including buttons, zips, laces, etc.) Bowels: Continent Bladder: Continent Toilet Use: Independent (on and off, dressing, wiping) Transfers (Bed to Chair and Back): Independent Mobility (On Level Surfaces): Wheelchair independent, including corners, > 50 yards STAIRS: Unable Barthel Index Total Score: 80  Modalities Skin Integrity Assessment:   Non Applicable  Education:  Yes, as described in interventions   ASSESSMENT   Therapy Program to Date: In summary, the therapy program has included: recommendations for new MWC, seating system.  Teaching use of equipment.    Progress Towards Goals:   All goals MET Goal 1: Patient and/or family will trial wheelchair bases and/or seating systems  to achieve optimal positioning and mobility and verbalize preferences for recommended equipment.Patient's wheelchair and seating system will be sized and adapted for seating needs  upon equipment delivery.Patient and/or family will demonstrate or verbalize independence with wheelchair management including wheelchair parts and mobilizing the chair as appropriate.Patient and/or family will verbalize and/or demonstrate understanding with recommended method and frequency required for adequate pressure redistribution.Patient will demonstrate most effective means of propelling the chair to conserve energy and preserve upper extremity function. Goal 2: Barthel Index will remain 80/100        Progress Summary: Patient has new Tilite AeroZ with Zoid cushion provided by [employer].  Pleased with equipment.   PLAN  Recommendations:  Physical Therapy services are discontinued at this time secondary to:  Goals have been achieved.  Development of Plan of Care:  The discharge plan was discussed and agreed upon by the patient and/or family.  Total Treatment Time (Timed & Untimed):  28 minutes Total Time in Timed Codes:  28 minutes     Electronically signed by: Rosaline Clapper, PT 12/06/15 1010

## 2016-01-22 ENCOUNTER — Emergency Department (HOSPITAL_BASED_OUTPATIENT_CLINIC_OR_DEPARTMENT_OTHER): Payer: BLUE CROSS/BLUE SHIELD

## 2016-01-22 ENCOUNTER — Encounter (HOSPITAL_BASED_OUTPATIENT_CLINIC_OR_DEPARTMENT_OTHER): Payer: Self-pay | Admitting: Emergency Medicine

## 2016-01-22 ENCOUNTER — Emergency Department (HOSPITAL_BASED_OUTPATIENT_CLINIC_OR_DEPARTMENT_OTHER)
Admission: EM | Admit: 2016-01-22 | Discharge: 2016-01-22 | Disposition: A | Discharge status: Home / Self Care | Payer: BLUE CROSS/BLUE SHIELD | Attending: Emergency Medicine | Admitting: Emergency Medicine

## 2016-01-22 DIAGNOSIS — R55 Syncope and collapse: Secondary | ICD-10-CM | POA: Diagnosis not present

## 2016-01-22 DIAGNOSIS — R109 Unspecified abdominal pain: Secondary | ICD-10-CM | POA: Diagnosis not present

## 2016-01-22 DIAGNOSIS — R197 Diarrhea, unspecified: Secondary | ICD-10-CM

## 2016-01-22 DIAGNOSIS — K529 Noninfective gastroenteritis and colitis, unspecified: Secondary | ICD-10-CM

## 2016-01-22 DIAGNOSIS — Z87891 Personal history of nicotine dependence: Secondary | ICD-10-CM | POA: Insufficient documentation

## 2016-01-22 DIAGNOSIS — R52 Pain, unspecified: Secondary | ICD-10-CM

## 2016-01-22 LAB — COMPREHENSIVE METABOLIC PANEL
ALBUMIN: 4.3 g/dL (ref 3.5–5.0)
ALT: 30 U/L (ref 14–54)
AST: 30 U/L (ref 15–41)
Alkaline Phosphatase: 46 U/L (ref 38–126)
Anion gap: 7 (ref 5–15)
BUN: 11 mg/dL (ref 6–20)
CHLORIDE: 106 mmol/L (ref 101–111)
CO2: 23 mmol/L (ref 22–32)
Calcium: 9.5 mg/dL (ref 8.9–10.3)
Creatinine, Ser: 0.44 mg/dL (ref 0.44–1.00)
GFR calc Af Amer: >60 mL/min (ref >60)
Glucose, Bld: 110 mg/dL — ABNORMAL HIGH (ref 65–99)
POTASSIUM: 3.3 mmol/L — AB (ref 3.5–5.1)
SODIUM: 136 mmol/L (ref 135–145)
Total Bilirubin: 0.3 mg/dL (ref 0.3–1.2)
Total Protein: 7.3 g/dL (ref 6.5–8.1)

## 2016-01-22 LAB — URINALYSIS, ROUTINE W REFLEX MICROSCOPIC
Glucose, UA: NEGATIVE mg/dL
HGB URINE DIPSTICK: NEGATIVE
Ketones, ur: 15 mg/dL — AB
Leukocytes, UA: NEGATIVE
Nitrite: NEGATIVE
Protein, ur: 30 mg/dL — AB
SPECIFIC GRAVITY, URINE: 1.023 (ref 1.005–1.030)
pH: 6 (ref 5.0–8.0)

## 2016-01-22 LAB — CBC WITH DIFFERENTIAL/PLATELET
BASOS ABS: 0 10*3/uL (ref 0.0–0.1)
BASOS PCT: 0 %
EOS ABS: 0.1 10*3/uL (ref 0.0–0.7)
EOS PCT: 1 %
HCT: 37.3 % (ref 36.0–46.0)
Hemoglobin: 12.9 g/dL (ref 12.0–15.0)
Lymphocytes Relative: 15 %
Lymphs Abs: 1.2 10*3/uL (ref 0.7–4.0)
MCH: 32.5 pg (ref 26.0–34.0)
MCHC: 34.6 g/dL (ref 30.0–36.0)
MCV: 94 fL (ref 78.0–100.0)
MONO ABS: 0.4 10*3/uL (ref 0.1–1.0)
Monocytes Relative: 5 %
Neutro Abs: 6.5 10*3/uL (ref 1.7–7.7)
Neutrophils Relative %: 79 %
PLATELETS: 202 10*3/uL (ref 150–400)
RBC: 3.97 MIL/uL (ref 3.87–5.11)
RDW: 12.9 % (ref 11.5–15.5)
WBC: 8.2 10*3/uL (ref 4.0–10.5)

## 2016-01-22 LAB — URINE MICROSCOPIC-ADD ON

## 2016-01-22 LAB — TROPONIN I: Troponin I: <0.03 ng/mL (ref <0.03)

## 2016-01-22 LAB — PREGNANCY, URINE: PREG TEST UR: NEGATIVE

## 2016-01-22 LAB — LIPASE, BLOOD: LIPASE: 18 U/L (ref 11–51)

## 2016-01-22 MED ORDER — KETOROLAC TROMETHAMINE 30 MG/ML IJ SOLN
30.0000 mg | Freq: Once | INTRAMUSCULAR | Status: DC
  Filled 2016-01-22: qty 1

## 2016-01-22 MED ORDER — DICYCLOMINE HCL 10 MG/ML IM SOLN
20.0000 mg | Freq: Once | INTRAMUSCULAR | Status: AC
  Administered 2016-01-22: 20 mg via INTRAMUSCULAR
  Filled 2016-01-22: qty 2

## 2016-01-22 MED ORDER — GI COCKTAIL ~~LOC~~
30.0000 mL | Freq: Once | ORAL | Status: AC
  Administered 2016-01-22: 30 mL via ORAL
  Filled 2016-01-22: qty 30

## 2016-01-22 MED ORDER — DICYCLOMINE HCL 20 MG PO TABS: 20.0000 mg | ORAL_TABLET | Freq: Two times a day (BID) | ORAL | 0 refills | Status: AC

## 2016-01-22 MED ORDER — ONDANSETRON 8 MG PO TBDP: ORAL_TABLET | ORAL | 0 refills | Status: AC

## 2016-01-22 NOTE — ED Triage Notes (Signed)
Pt reports feeling like she had "heart burn" earlier and then had a syncopal episode. Husband described pt as "going in and out.". Pt states she now has burning type abd pain.

## 2016-01-22 NOTE — ED Provider Notes (Signed)
[facility] Provider Note   CSN: [id number] Arrival date & time: 01/22/16  0116     History   Chief Complaint Chief Complaint  Patient presents with  . Near Syncope  . Abdominal Pain    HPI Stacey Chaney is a 40 y.o. female.  The history is provided by the patient.  Abdominal Pain   This is a new problem. The current episode started 3 to 5 hours ago. The problem occurs constantly. Progression since onset: waxing and waning. The pain is associated with an unknown factor. Pain location: below belly button. The quality of the pain is burning. The pain is moderate. Associated symptoms include diarrhea. Pertinent negatives include anorexia, fever, belching, melena, nausea, vomiting, constipation, frequency, hematuria, headaches, arthralgias and myalgias. Nothing aggravates the symptoms. Nothing relieves the symptoms. Past workup does not include GI consult. Her past medical history does not include PUD.  Near Syncope  This is a new problem. The current episode started 3 to 5 hours ago. The problem occurs rarely. The problem has been resolved. Associated symptoms include abdominal pain. Pertinent negatives include no chest pain, no headaches and no shortness of breath. Nothing aggravates the symptoms. Nothing relieves the symptoms. She has tried nothing for the symptoms. The treatment provided no relief.    Past Medical History:  Diagnosis Date  . Anxiety   . Aortic stenosis   . Heart murmur   . Mitral valve prolapse   . Paraplegia [facility])    since age 10    There are no active problems to display for this patient.   Past Surgical History:  Procedure Laterality Date  . AORTA SURGERY    . CESAREAN SECTION    . TUBAL LIGATION      OB History    No data available       Home Medications    Prior to Admission medications   Medication Sig Start Date End Date Taking? Authorizing Provider  acetaminophen (TYLENOL) 500 MG tablet Take 500 mg by mouth every 6 (six) hours  as needed. For pain     Historical Provider, MD  Multiple Vitamins-Minerals (MULTIVITAMINS THER. W/MINERALS) TABS Take 1 tablet by mouth daily.      Historical Provider, MD  Omega-3 Fatty Acids (FISH OIL PO) Take 2 capsules by mouth daily.      Historical Provider, MD    Family History No family history on file.  Social History Social History  Substance Use Topics  . Smoking status: Former Games developer  . Smokeless tobacco: Never Used  . Alcohol use 0.6 oz/week    1 Glasses of wine per week     Allergies   Chocolate and Rocephin [ceftriaxone sodium in dextrose]   Review of Systems Review of Systems  Constitutional: Negative for fever.  Respiratory: Negative for shortness of breath.   Cardiovascular: Positive for near-syncope. Negative for chest pain.  Gastrointestinal: Positive for abdominal pain and diarrhea. Negative for anorexia, constipation, melena, nausea and vomiting.  Genitourinary: Negative for frequency and hematuria.  Musculoskeletal: Negative for arthralgias and myalgias.  Neurological: Negative for seizures, syncope, speech difficulty, weakness and headaches.  All other systems reviewed and are negative.    Physical Exam Updated Vital Signs BP (!) 96/54   Pulse 88   Temp 98.1 F (36.7 C) (Oral)   Resp 16   Ht 5\' 2"  (1.575 m)   Wt 145 lb (65.8 kg)   SpO2 100%   BMI 26.52 kg/m   Physical Exam  Constitutional: She  appears well-developed and well-nourished. No distress.  HENT:  Head: Normocephalic and atraumatic.  Mouth/Throat: No oropharyngeal exudate.  Eyes: EOM are normal. Pupils are equal, round, and reactive to light.  Neck: Normal range of motion. Neck supple.  Cardiovascular: Normal rate, regular rhythm, normal heart sounds and intact distal pulses.   Pulmonary/Chest: Effort normal. No respiratory distress. She has no wheezes. She has no rales.  Abdominal: Soft. She exhibits no mass. Bowel sounds are increased. There is no tenderness. There is no  rigidity, no rebound, no guarding, no tenderness at McBurney's point and negative Murphy's sign.     ED Treatments / Results   Vitals:   01/22/16 0130  BP: (!) 96/54  Pulse: 88  Resp: 16  Temp: 98.1 F (36.7 C)   Results for orders placed or performed during the hospital encounter of 01/22/16  Pregnancy, urine  Result Value Ref Range   Preg Test, Ur NEGATIVE NEGATIVE  CBC with Differential/Platelet  Result Value Ref Range   WBC 8.2 4.0 - 10.5 K/uL   RBC 3.97 3.87 - 5.11 MIL/uL   Hemoglobin 12.9 12.0 - 15.0 g/dL   HCT 16.137.3 09.636.0 - 04.546.0 %   MCV 94.0 78.0 - 100.0 fL   MCH 32.5 26.0 - 34.0 pg   MCHC 34.6 30.0 - 36.0 g/dL   RDW 40.912.9 81.111.5 - 91.415.5 %   Platelets 202 150 - 400 K/uL   Neutrophils Relative % 79 %   Neutro Abs 6.5 1.7 - 7.7 K/uL   Lymphocytes Relative 15 %   Lymphs Abs 1.2 0.7 - 4.0 K/uL   Monocytes Relative 5 %   Monocytes Absolute 0.4 0.1 - 1.0 K/uL   Eosinophils Relative 1 %   Eosinophils Absolute 0.1 0.0 - 0.7 K/uL   Basophils Relative 0 %   Basophils Absolute 0.0 0.0 - 0.1 K/uL  Comprehensive metabolic panel  Result Value Ref Range   Sodium 136 135 - 145 mmol/L   Potassium 3.3 (L) 3.5 - 5.1 mmol/L   Chloride 106 101 - 111 mmol/L   CO2 23 22 - 32 mmol/L   Glucose, Bld 110 (H) 65 - 99 mg/dL   BUN 11 6 - 20 mg/dL   Creatinine, Ser 7.820.44 0.44 - 1.00 mg/dL   Calcium 9.5 8.9 - 95.610.3 mg/dL   Total Protein 7.3 6.5 - 8.1 g/dL   Albumin 4.3 3.5 - 5.0 g/dL   AST 30 15 - 41 U/L   ALT 30 14 - 54 U/L   Alkaline Phosphatase 46 38 - 126 U/L   Total Bilirubin 0.3 0.3 - 1.2 mg/dL   GFR calc non Af Amer >60 >60 mL/min   GFR calc Af Amer >60 >60 mL/min   Anion gap 7 5 - 15  Lipase, blood  Result Value Ref Range   Lipase 18 11 - 51 U/L  Troponin I  Result Value Ref Range   Troponin I <0.03 <0.03 ng/mL  Urinalysis, Routine w reflex microscopic (not [facility])  Result Value Ref Range   Color, Urine YELLOW YELLOW   APPearance CLOUDY (A) CLEAR   Specific Gravity, Urine  1.023 1.005 - 1.030   pH 6.0 5.0 - 8.0   Glucose, UA NEGATIVE NEGATIVE mg/dL   Hgb urine dipstick NEGATIVE NEGATIVE   Bilirubin Urine SMALL (A) NEGATIVE   Ketones, ur 15 (A) NEGATIVE mg/dL   Protein, ur 30 (A) NEGATIVE mg/dL   Nitrite NEGATIVE NEGATIVE   Leukocytes, UA NEGATIVE NEGATIVE  Urine microscopic-add on  Result Value Ref Range   Squamous Epithelial / LPF TOO NUMEROUS TO COUNT (A) NONE SEEN   WBC, UA 0-5 0 - 5 WBC/hpf   RBC / HPF 0-5 0 - 5 RBC/hpf   Bacteria, UA MANY (A) NONE SEEN   Urine-Other MUCOUS PRESENT    Ct Renal Stone Study  Result Date: 01/22/2016 CLINICAL DATA:  Burning periumbilical pain for several hours. Heartburn in syncopal episodes this evening. History of kidney stones, paraplegia. EXAM: CT ABDOMEN AND PELVIS WITHOUT CONTRAST TECHNIQUE: Multidetector CT imaging of the abdomen and pelvis was performed following the standard protocol without IV contrast. COMPARISON:  CT abdomen and pelvis February 02, 2011 FINDINGS: LOWER CHEST: Lung bases are clear. The visualized heart size is normal. No pericardial effusion. HEPATOBILIARY: Normal. PANCREAS: Normal. SPLEEN: Normal. ADRENALS/URINARY TRACT: Kidneys are orthotopic, demonstrating normal size and morphology. No nephrolithiasis, hydronephrosis; limited assessment for renal masses on this nonenhanced examination. The unopacified ureters are normal in course and caliber. Urinary bladder is partially distended and unremarkable. Normal adrenal glands. STOMACH/BOWEL: Sensitivity may be decreased by lack of enteric contrast. Fluid filled nondistended small and large bowel with air-fluid levels. Ovoid similar density noninflamed 7 cm structure in RIGHT lower quadrant. The appendix is not discretely identified, however there are no inflammatory changes in the right lower quadrant. VASCULAR/LYMPHATIC: Aortoiliac vessels are normal in course and caliber. No lymphadenopathy by CT size criteria. REPRODUCTIVE: Nonsuspicious. OTHER: No  intraperitoneal free fluid or free air. MUSCULOSKELETAL: Non-acute. Severe fatty atrophy of the gluteal muscles, hip muscles. Osteopenia. Shallow acetabula. IMPRESSION: Fluid in small and large bowel most compatible with enteritis. No bowel obstruction. Fluid distended small bowel in RIGHT lower quadrant versus 7 cm ovarian cyst. Recommend follow-up pelvic ultrasound in 6-12 weeks. No nephrolithiasis or hydronephrosis. Electronically Signed   By: Awilda Metro M.D.   On: 01/22/2016 04:28    EKG Interpretation  Date/Time:  Tuesday January 22 2016 01:36:24 EDT Ventricular Rate:  84 PR Interval:    QRS Duration: 119 QT Interval:  403 QTC Calculation: 477 R Axis:   20 Text Interpretation:  Sinus rhythm Confirmed by [facility]  MD, Ayannah Faddis ([id number]) on 01/22/2016 1:39:06 AM       EKG  EKG Interpretation  Date/Time:  Tuesday January 22 2016 01:36:24 EDT Ventricular Rate:  84 PR Interval:    QRS Duration: 119 QT Interval:  403 QTC Calculation: 477 R Axis:   20 Text Interpretation:  Sinus rhythm Confirmed by [facility]  MD, Aslan Himes ([id number]) on 01/22/2016 1:39:06 AM       Procedures Procedures (including critical care time)  Medications Ordered in ED Medications  ketorolac (TORADOL) 30 MG/ML injection 30 mg (30 mg Intravenous Refused 01/22/16 0322)  gi cocktail (Maalox,Lidocaine,Donnatal) (30 mLs Oral Given 01/22/16 0225)  dicyclomine (BENTYL) injection 20 mg (20 mg Intramuscular Given 01/22/16 0319)    Urine is contaminated with too numerous to count squamous epithelial cells. Exam and symptoms are not consistent with UTI Final Clinical Impressions(s) / ED Diagnoses  Enteritis:  All questions answered to patient's parents satisfaction. Based on history and exam patient has been appropriately medically screened and emergency conditions excluded. Patient is stable for discharge at this time. Follow up with your PMD for recheck in 2 days and strict return precautions given.  Must  return for fevers, spread or any nose or eye involvement     Jayra Choyce, MD 01/22/16 [phone]

## 2016-02-19 NOTE — Unmapped External Note (Signed)
 Physical Therapy Discharge of Dormant File  Referring Diagnosis:  1. Paraplegia (HCC)    2. Chronic midline thoracic back pain    3. Chronic neck and back pain    4. Chronic pain of both shoulders    5. Persistent headaches       Referring Clinician:  No ref. provider found Therapy Diagnosis:   1. Paraplegia (HCC)    2. Chronic midline thoracic back pain    3. Chronic neck and back pain    4. Chronic pain of both shoulders    5. Persistent headaches     Initial Evaluation Date:  07/18/2015  Number of Visits to Date: PT Visit Count: 3   Number of Missed Visits: Missed Visit Count: 1   Date of Last Completed Visit: 08/06/2015  The patient has not been seen for additional therapy services since last progress note / plan of care.  Please refer to the last therapy note/plan, dated above, for specific details regarding progress, goals, and patient status. Due to a lapse in time since last scheduled visit, the patient is formally discharged from therapy services.   Discharge Reason:  As above  Patient discharged at this time, further therapy will require a physician referral.      Electronically signed by: Anette Fausto Roulette, PT 02/19/16 [phone]

## 2017-08-17 ENCOUNTER — Emergency Department (HOSPITAL_BASED_OUTPATIENT_CLINIC_OR_DEPARTMENT_OTHER): Payer: BLUE CROSS/BLUE SHIELD

## 2017-08-17 ENCOUNTER — Emergency Department (HOSPITAL_BASED_OUTPATIENT_CLINIC_OR_DEPARTMENT_OTHER)
Admission: EM | Admit: 2017-08-17 | Discharge: 2017-08-18 | Disposition: A | Discharge status: Home / Self Care | Payer: BLUE CROSS/BLUE SHIELD | Attending: Emergency Medicine | Admitting: Emergency Medicine

## 2017-08-17 ENCOUNTER — Other Ambulatory Visit: Payer: Self-pay

## 2017-08-17 ENCOUNTER — Encounter (HOSPITAL_BASED_OUTPATIENT_CLINIC_OR_DEPARTMENT_OTHER): Payer: Self-pay | Admitting: *Deleted

## 2017-08-17 DIAGNOSIS — I499 Cardiac arrhythmia, unspecified: Secondary | ICD-10-CM | POA: Insufficient documentation

## 2017-08-17 DIAGNOSIS — Z5321 Procedure and treatment not carried out due to patient leaving prior to being seen by health care provider: Secondary | ICD-10-CM | POA: Diagnosis not present

## 2017-08-17 LAB — BASIC METABOLIC PANEL
Anion gap: 9 (ref 5–15)
BUN: 11 mg/dL (ref 6–20)
CHLORIDE: 107 mmol/L (ref 101–111)
CO2: 21 mmol/L — ABNORMAL LOW (ref 22–32)
Calcium: 8.9 mg/dL (ref 8.9–10.3)
Creatinine, Ser: 0.47 mg/dL (ref 0.44–1.00)
GFR calc Af Amer: >60 mL/min (ref >60)
GFR calc non Af Amer: >60 mL/min (ref >60)
Glucose, Bld: 117 mg/dL — ABNORMAL HIGH (ref 65–99)
Potassium: 3.9 mmol/L (ref 3.5–5.1)
SODIUM: 137 mmol/L (ref 135–145)

## 2017-08-17 LAB — CBC
HCT: 36.4 % (ref 36.0–46.0)
HEMOGLOBIN: 12.9 g/dL (ref 12.0–15.0)
MCH: 33.2 pg (ref 26.0–34.0)
MCHC: 35.4 g/dL (ref 30.0–36.0)
MCV: 93.6 fL (ref 78.0–100.0)
Platelets: 208 10*3/uL (ref 150–400)
RBC: 3.89 MIL/uL (ref 3.87–5.11)
RDW: 12.8 % (ref 11.5–15.5)
WBC: 4.9 10*3/uL (ref 4.0–10.5)

## 2017-08-17 LAB — TROPONIN I: Troponin I: <0.03 ng/mL (ref <0.03)

## 2017-08-17 NOTE — ED Triage Notes (Signed)
Irregular heartbeat x 10 minutes.  Hx of SVT.

## 2017-08-18 ENCOUNTER — Encounter (INDEPENDENT_AMBULATORY_CARE_PROVIDER_SITE_OTHER): Payer: Self-pay

## 2017-08-18 NOTE — ED Notes (Signed)
States," I can't wait any longer for the Dr, I have to get home to my kids" Denies any pain or palpations .

## 2017-08-19 NOTE — ED Notes (Signed)
08/19/2017, Follow-up call completed, message left.

## 2019-03-06 ENCOUNTER — Emergency Department (HOSPITAL_BASED_OUTPATIENT_CLINIC_OR_DEPARTMENT_OTHER): Payer: BC Managed Care – PPO

## 2019-03-06 ENCOUNTER — Encounter (HOSPITAL_BASED_OUTPATIENT_CLINIC_OR_DEPARTMENT_OTHER): Payer: Self-pay | Admitting: *Deleted

## 2019-03-06 ENCOUNTER — Other Ambulatory Visit: Payer: Self-pay

## 2019-03-06 ENCOUNTER — Emergency Department (HOSPITAL_BASED_OUTPATIENT_CLINIC_OR_DEPARTMENT_OTHER)
Admission: EM | Admit: 2019-03-06 | Discharge: 2019-03-07 | Disposition: A | Discharge status: Home / Self Care | Payer: BC Managed Care – PPO | Attending: Emergency Medicine | Admitting: Emergency Medicine

## 2019-03-06 DIAGNOSIS — I341 Nonrheumatic mitral (valve) prolapse: Secondary | ICD-10-CM | POA: Diagnosis not present

## 2019-03-06 DIAGNOSIS — R072 Precordial pain: Secondary | ICD-10-CM | POA: Diagnosis not present

## 2019-03-06 DIAGNOSIS — Z79899 Other long term (current) drug therapy: Secondary | ICD-10-CM | POA: Insufficient documentation

## 2019-03-06 DIAGNOSIS — Z87891 Personal history of nicotine dependence: Secondary | ICD-10-CM | POA: Insufficient documentation

## 2019-03-06 DIAGNOSIS — R002 Palpitations: Secondary | ICD-10-CM | POA: Diagnosis not present

## 2019-03-06 LAB — BASIC METABOLIC PANEL
Anion gap: 6 (ref 5–15)
BUN: 8 mg/dL (ref 6–20)
CO2: 24 mmol/L (ref 22–32)
Calcium: 8.9 mg/dL (ref 8.9–10.3)
Chloride: 107 mmol/L (ref 98–111)
Creatinine, Ser: 0.45 mg/dL (ref 0.44–1.00)
GFR calc Af Amer: >60 mL/min (ref >60)
GFR calc non Af Amer: >60 mL/min (ref >60)
Glucose, Bld: 99 mg/dL (ref 70–99)
Potassium: 3.6 mmol/L (ref 3.5–5.1)
Sodium: 137 mmol/L (ref 135–145)

## 2019-03-06 LAB — CBC WITH DIFFERENTIAL/PLATELET
Abs Immature Granulocytes: 0 10*3/uL (ref 0.00–0.07)
Basophils Absolute: 0 10*3/uL (ref 0.0–0.1)
Basophils Relative: 1 %
Eosinophils Absolute: 0.4 10*3/uL (ref 0.0–0.5)
Eosinophils Relative: 9 %
HCT: 36.1 % (ref 36.0–46.0)
Hemoglobin: 11.8 g/dL — ABNORMAL LOW (ref 12.0–15.0)
Immature Granulocytes: 0 %
Lymphocytes Relative: 34 %
Lymphs Abs: 1.4 10*3/uL (ref 0.7–4.0)
MCH: 32 pg (ref 26.0–34.0)
MCHC: 32.7 g/dL (ref 30.0–36.0)
MCV: 97.8 fL (ref 80.0–100.0)
Monocytes Absolute: 0.5 10*3/uL (ref 0.1–1.0)
Monocytes Relative: 12 %
Neutro Abs: 1.8 10*3/uL (ref 1.7–7.7)
Neutrophils Relative %: 44 %
Platelets: 186 10*3/uL (ref 150–400)
RBC: 3.69 MIL/uL — ABNORMAL LOW (ref 3.87–5.11)
RDW: 13.1 % (ref 11.5–15.5)
WBC: 4.1 10*3/uL (ref 4.0–10.5)
nRBC: 0 % (ref 0.0–0.2)

## 2019-03-06 LAB — TROPONIN I (HIGH SENSITIVITY): Troponin I (High Sensitivity): 5 ng/L (ref <18)

## 2019-03-06 LAB — D-DIMER, QUANTITATIVE: D-Dimer, Quant: <0.27 ug/mL-FEU (ref 0.00–0.50)

## 2019-03-06 NOTE — ED Provider Notes (Signed)
[facility] EMERGENCY DEPARTMENT Provider Note   CSN: [id number] Arrival date & time: 03/06/19  2041     History   Chief Complaint Chief Complaint  Patient presents with  . Palpitations    HPI Stacey Chaney is a 43 y.o. female.     The history is provided by the patient and medical records. No language interpreter was used.  Palpitations  Stacey Chaney is a 43 y.o. female who presents to the Emergency Department complaining of palpitations. She presents the emergency department for evaluation following an episode of palpitations that occurred just prior to ED arrival. She was sitting in the car developed central chest pressure with a sensation of palpitations. She checked her Apple watch and noticed a heart rate of 160. The episode lasted about 20 minutes. She denies any recent illnesses. Currently she feels full in her chest but her pain has resolved and her palpitations have resolved. She denies any fevers, cough, shortness of breath, diaphoresis, abdominal pain, nausea, vomiting. 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 She denies any recent illnesses or COVID 19 exposures. She does have a remote history of SVT in the past. She denies any lower extremity edema or pain. Denies any chance of pregnancy. No history of PE/DVT. Past Medical History:  Diagnosis Date  . Anxiety   . Aortic stenosis   . Heart murmur   . Mitral valve prolapse   . Paraplegia [facility](HCC)    since age 73    There are no active problems to display for this patient.   Past Surgical History:  Procedure Laterality Date  . AORTA SURGERY    . CESAREAN SECTION    . TUBAL LIGATION       OB History   No obstetric history on file.      Home Medications    Prior to Admission medications   Medication Sig Start Date End Date Taking? Authorizing Provider  escitalopram  (LEXAPRO) 10 MG tablet Take by mouth. 12/30/18  Yes [provider]  Multiple Vitamins-Minerals (MULTIVITAMINS THER. W/MINERALS) TABS Take 1 tablet by mouth daily.     Yes [provider]  Omega-3 Fatty Acids (FISH OIL PO) Take 2 capsules by mouth daily.     Yes [provider]  acetaminophen (TYLENOL) 500 MG tablet Take 500 mg by mouth every 6 (six) hours as needed. For pain     [provider]  dicyclomine (BENTYL) 20 MG tablet Take 1 tablet (20 mg total) by mouth 2 (two) times daily. 01/22/16   Palumbo, April, MD  ondansetron (ZOFRAN ODT) 8 MG disintegrating tablet 8mg  ODT q8 hours prn nausea 01/22/16   Palumbo, April, MD    Family History No family history on file.  Social History Social History   Tobacco Use  . Smoking status: Former Games developermoker  . Smokeless tobacco: Never Used  Substance Use Topics  . Alcohol use: Yes    Alcohol/week: 1.0 standard drinks    Types: 1 Glasses of wine per week  . Drug use: No     Allergies   Chocolate and Rocephin [ceftriaxone sodium in dextrose]   Review of Systems Review of Systems  Cardiovascular: Positive for palpitations.  All other systems reviewed and are negative.    Physical Exam Updated Vital Signs BP 117/60 (BP Location: Right Arm)   Pulse 82   Temp  98.8 F (37.1 C) (Oral)   Resp 18   Ht 5\' 2"  (1.575 m)   Wt 65.8 kg   LMP 02/25/2019   SpO2 100%   BMI 26.52 kg/m   Physical Exam Vitals signs and nursing note reviewed.  Constitutional:      Appearance: She is well-developed.  HENT:     Head: Normocephalic and atraumatic.  Cardiovascular:     Rate and Rhythm: Normal rate and regular rhythm.     Heart sounds: No murmur.  Pulmonary:     Effort: Pulmonary effort is normal. No respiratory distress.     Breath sounds: Normal breath sounds.  Abdominal:     Palpations: Abdomen is soft.     Tenderness: There is no abdominal tenderness. There is no guarding or rebound.  Musculoskeletal:         General: No swelling or tenderness.  Skin:    General: Skin is warm and dry.  Neurological:     Mental Status: She is alert and oriented to person, place, and time.  Psychiatric:        Behavior: Behavior normal.      ED Treatments / Results  Labs (all labs ordered are listed, but only abnormal results are displayed) Labs Reviewed  CBC WITH DIFFERENTIAL/PLATELET - Abnormal; Notable for the following components:      Result Value   RBC 3.69 (*)    Hemoglobin 11.8 (*)    All other components within normal limits  TROPONIN I (HIGH SENSITIVITY) - Abnormal; Notable for the following components:   Troponin I (High Sensitivity) 19 (*)    All other components within normal limits  BASIC METABOLIC PANEL  D-DIMER, QUANTITATIVE (NOT AT [facility])  TROPONIN I (HIGH SENSITIVITY)    EKG EKG Interpretation  Date/Time:  Sunday March 06 2019 21:11:45 EST Ventricular Rate:  91 PR Interval:    QRS Duration: 124 QT Interval:  409 QTC Calculation: 504 R Axis:   19 Text Interpretation: Sinus rhythm Nonspecific intraventricular conduction delay Confirmed by Quintella Reichert [phone]) on 03/06/2019 10:04:53 PM   Radiology Dg Chest Port 1 View  Result Date: 03/06/2019 CLINICAL DATA:  43 year old female with chest palpitation. EXAM: PORTABLE CHEST 1 VIEW COMPARISON:  Chest radiograph dated 08/17/2017. FINDINGS: The lungs are clear. There is no pleural effusion or pneumothorax. The cardiac silhouette is within normal limits. Multiple surgical clips over the mediastinum. No acute osseous pathology. IMPRESSION: No active disease. Electronically Signed   By: Anner Crete M.D.   On: 03/06/2019 22:27    Procedures Procedures (including critical care time)  Medications Ordered in ED Medications - No data to display   Initial Impression / Assessment and Plan / ED Course  I have reviewed the triage vital signs and the nursing notes.  Pertinent labs & imaging results that were available  during my care of the patient were reviewed by me and considered in my medical decision making (see chart for details).        Patient here for evaluation of episode of palpitations as well as some associated chest pressure. She is symptom-free on ED evaluation. EKG was sinus rhythm, no acute ischemic changes. Presentation is not consistent with PE, she is a low risk in the dimer is negative. Initial troponin is negative. Plan to recheck troponin and if negative plan to discharge home with outpatient cardiology follow-up as well as return precautions.  Review troponin is minimally elevated 19. Patient is asymptomatic on reassessment in the emergency department, no recurrent  palpitations. Discussed with patient options of hospital observation versus repeat troponin. Patient prefers repeat troponin and if troponins are staying flat or down trending may discharge home with outpatient cardiology follow-up and return precautions. Patient care transferred pending repeat troponin. Final Clinical Impressions(s) / ED Diagnoses   Final diagnoses:  Palpitations  Precordial pain    ED Discharge Orders    None       Tilden Fossa, MD 03/07/19 0106

## 2019-03-06 NOTE — Discharge Instructions (Addendum)
Follow-up closely with cardiology as an outpatient.  If you have recurrence or worsening of symptoms you should be reevaluated.

## 2019-03-06 NOTE — ED Triage Notes (Signed)
Pt reports rapid heart rate, onset ~20 mins pta. States apple watch was reading in the 160s. States she had burning in her chest at the time. Sx improving at present

## 2019-03-06 NOTE — ED Notes (Signed)
Pt in BR 

## 2019-03-07 LAB — TROPONIN I (HIGH SENSITIVITY)
Troponin I (High Sensitivity): 13 ng/L (ref <18)
Troponin I (High Sensitivity): 19 ng/L — ABNORMAL HIGH (ref <18)

## 2019-03-07 MED ORDER — ASPIRIN 81 MG PO CHEW: 324.0000 mg | CHEWABLE_TABLET | Freq: Once | ORAL | Status: DC

## 2019-03-07 NOTE — ED Provider Notes (Signed)
Patient signed out pending repeat troponin.  In brief, had a short episode of palpitations at home.  History of coarctation repaired as a child.  Otherwise low risk for ACS.  Nonischemic EKG here.  Initial troponin 5.  Repeat troponin 19.  Patient does not wish to be admitted.  Given that she is low risk, will trend 1 more time to see if she continues to uptrend.  If stable or downtrending, patient will be discharged with close cardiology follow-up.  Repeat troponin is 13.  Patient has remained asymptomatic while in the emergency department.  No events noted on the monitor.  Discussed plan with patient.  Will discharge home per Dr. Zenia Resides discharge instructions.  After history, exam, and medical workup I feel the patient has been appropriately medically screened and is safe for discharge home. Pertinent diagnoses were discussed with the patient. Patient was given return precautions.    Merryl Hacker, MD 03/07/19 [phone]

## 2019-05-29 ENCOUNTER — Other Ambulatory Visit: Payer: Self-pay

## 2019-05-29 ENCOUNTER — Encounter (HOSPITAL_BASED_OUTPATIENT_CLINIC_OR_DEPARTMENT_OTHER): Payer: Self-pay | Admitting: Emergency Medicine

## 2019-05-29 ENCOUNTER — Emergency Department (HOSPITAL_BASED_OUTPATIENT_CLINIC_OR_DEPARTMENT_OTHER)
Admission: EM | Admit: 2019-05-29 | Discharge: 2019-05-29 | Disposition: A | Discharge status: Home / Self Care | Payer: BC Managed Care – PPO | Attending: Emergency Medicine | Admitting: Emergency Medicine

## 2019-05-29 DIAGNOSIS — Z87891 Personal history of nicotine dependence: Secondary | ICD-10-CM | POA: Diagnosis not present

## 2019-05-29 DIAGNOSIS — I341 Nonrheumatic mitral (valve) prolapse: Secondary | ICD-10-CM | POA: Diagnosis not present

## 2019-05-29 DIAGNOSIS — H81399 Other peripheral vertigo, unspecified ear: Secondary | ICD-10-CM | POA: Diagnosis not present

## 2019-05-29 DIAGNOSIS — R011 Cardiac murmur, unspecified: Secondary | ICD-10-CM | POA: Diagnosis not present

## 2019-05-29 DIAGNOSIS — Z79899 Other long term (current) drug therapy: Secondary | ICD-10-CM | POA: Diagnosis not present

## 2019-05-29 DIAGNOSIS — R000 Tachycardia, unspecified: Secondary | ICD-10-CM | POA: Diagnosis present

## 2019-05-29 LAB — CBC WITH DIFFERENTIAL/PLATELET
Abs Immature Granulocytes: 0.01 10*3/uL (ref 0.00–0.07)
Basophils Absolute: 0 10*3/uL (ref 0.0–0.1)
Basophils Relative: 0 %
Eosinophils Absolute: 0.1 10*3/uL (ref 0.0–0.5)
Eosinophils Relative: 1 %
HCT: 35 % — ABNORMAL LOW (ref 36.0–46.0)
Hemoglobin: 11.8 g/dL — ABNORMAL LOW (ref 12.0–15.0)
Immature Granulocytes: 0 %
Lymphocytes Relative: 21 %
Lymphs Abs: 1 10*3/uL (ref 0.7–4.0)
MCH: 33 pg (ref 26.0–34.0)
MCHC: 33.7 g/dL (ref 30.0–36.0)
MCV: 97.8 fL (ref 80.0–100.0)
Monocytes Absolute: 0.3 10*3/uL (ref 0.1–1.0)
Monocytes Relative: 6 %
Neutro Abs: 3.4 10*3/uL (ref 1.7–7.7)
Neutrophils Relative %: 72 %
Platelets: 161 10*3/uL (ref 150–400)
RBC: 3.58 MIL/uL — ABNORMAL LOW (ref 3.87–5.11)
RDW: 12.3 % (ref 11.5–15.5)
WBC: 4.7 10*3/uL (ref 4.0–10.5)
nRBC: 0 % (ref 0.0–0.2)

## 2019-05-29 LAB — BASIC METABOLIC PANEL
Anion gap: 9 (ref 5–15)
BUN: 9 mg/dL (ref 6–20)
CO2: 21 mmol/L — ABNORMAL LOW (ref 22–32)
Calcium: 8.8 mg/dL — ABNORMAL LOW (ref 8.9–10.3)
Chloride: 106 mmol/L (ref 98–111)
Creatinine, Ser: 0.37 mg/dL — ABNORMAL LOW (ref 0.44–1.00)
GFR calc Af Amer: >60 mL/min (ref >60)
GFR calc non Af Amer: >60 mL/min (ref >60)
Glucose, Bld: 115 mg/dL — ABNORMAL HIGH (ref 70–99)
Potassium: 3.6 mmol/L (ref 3.5–5.1)
Sodium: 136 mmol/L (ref 135–145)

## 2019-05-29 MED ORDER — MECLIZINE HCL 25 MG PO TABS
25.0000 mg | ORAL_TABLET | Freq: Once | ORAL | Status: AC
  Administered 2019-05-29: 03:00:00 25 mg via ORAL
  Filled 2019-05-29: qty 1

## 2019-05-29 MED ORDER — MECLIZINE HCL 25 MG PO TABS: 25.0000 mg | ORAL_TABLET | Freq: Three times a day (TID) | ORAL | 0 refills | Status: AC | PRN

## 2019-05-29 MED ORDER — SODIUM CHLORIDE 0.9 % IV BOLUS
1000.0000 mL | Freq: Once | INTRAVENOUS | Status: AC
  Administered 2019-05-29: 1000 mL via INTRAVENOUS

## 2019-05-29 NOTE — ED Provider Notes (Signed)
[facility] EMERGENCY DEPARTMENT Provider Note   CSN: [id number] Arrival date & time: 05/29/19  0235   History Chief Complaint  Patient presents with  . Tachycardia    Stacey Chaney is a 44 y.o. female.  The history is provided by the patient.  She has history of mitral valve prolapse, aortic stenosis, SVT, paraplegia and comes in because of vertigo.  At about 11 PM, she noted that the room was spinning and was worse if she laid on her right side.  There is associated nausea but no vomiting.  She denies ear pain, tinnitus, hearing loss.  She also noted that her heart rate was very high and at 1 point, her watch recorded a rate of 150.  This felt like episodes of SVT she has had in the past.  She denies chest pain, dyspnea.  Past Medical History:  Diagnosis Date  . Anxiety   . Aortic stenosis   . Heart murmur   . Mitral valve prolapse   . Paraplegia [facility])    since age 26    There are no problems to display for this patient.   Past Surgical History:  Procedure Laterality Date  . AORTA SURGERY    . CESAREAN SECTION    . TUBAL LIGATION       OB History   No obstetric history on file.     No family history on file.  Social History   Tobacco Use  . Smoking status: Former Games developer  . Smokeless tobacco: Never Used  Substance Use Topics  . Alcohol use: Yes    Alcohol/week: 1.0 standard drinks    Types: 1 Glasses of wine per week  . Drug use: No    Home Medications Prior to Admission medications   Medication Sig Start Date End Date Taking? Authorizing Provider  acetaminophen (TYLENOL) 500 MG tablet Take 500 mg by mouth every 6 (six) hours as needed. For pain     [provider]  dicyclomine (BENTYL) 20 MG tablet Take 1 tablet (20 mg total) by mouth 2 (two) times daily. 01/22/16   Palumbo, April, MD  escitalopram (LEXAPRO) 10 MG tablet Take by mouth. 12/30/18   [provider]  Multiple Vitamins-Minerals (MULTIVITAMINS THER. W/MINERALS) TABS  Take 1 tablet by mouth daily.      [provider]  Omega-3 Fatty Acids (FISH OIL PO) Take 2 capsules by mouth daily.      [provider]  ondansetron (ZOFRAN ODT) 8 MG disintegrating tablet 8mg  ODT q8 hours prn nausea 01/22/16   Palumbo, April, MD    Allergies    Chocolate and Rocephin [ceftriaxone sodium in dextrose]  Review of Systems   Review of Systems  All other systems reviewed and are negative.   Physical Exam Updated Vital Signs BP 109/68   Pulse (!) 106   Resp 19   Ht 5\' 2"  (1.575 m)   Wt 64.9 kg   SpO2 100%   BMI 26.16 kg/m   Physical Exam Vitals and nursing note reviewed.   44 year old female, resting comfortably and in no acute distress. Vital signs are significant for mildly elevated heart rate. Oxygen saturation is 100%, which is normal. Head is normocephalic and atraumatic. PERRLA, EOMI. Oropharynx is clear.  There is no nystagmus, but vertigo is reproduced by far lateral gaze. Neck is nontender and supple without adenopathy or JVD. Back is nontender and there is no CVA tenderness. Lungs are clear without rales, wheezes, or rhonchi. Chest is  nontender. Heart has regular rate and rhythm without murmur. Abdomen is soft, flat, nontender without masses or hepatosplenomegaly and peristalsis is normoactive. Extremities have no cyanosis or edema, full range of motion is present. Skin is warm and dry without rash. Neurologic: Mental status is normal, cranial nerves are intact.  She is paraplegic.  ED Results / Procedures / Treatments   Labs (all labs ordered are listed, but only abnormal results are displayed) Labs Reviewed  BASIC METABOLIC PANEL - Abnormal; Notable for the following components:      Result Value   CO2 21 (*)    Glucose, Bld 115 (*)    Creatinine, Ser 0.37 (*)    Calcium 8.8 (*)    All other components within normal limits  CBC WITH DIFFERENTIAL/PLATELET - Abnormal; Notable for the following components:   RBC 3.58 (*)     Hemoglobin 11.8 (*)    HCT 35.0 (*)    All other components within normal limits    EKG EKG Interpretation  Date/Time:  Sunday May 29 2019 02:48:03 EST Ventricular Rate:  99 PR Interval:    QRS Duration: 114 QT Interval:  381 QTC Calculation: 489 R Axis:   66 Text Interpretation: Sinus tachycardia Ventricular premature complex Borderline intraventricular conduction delay Borderline prolonged QT interval Baseline wander in lead(s) V6 When compared with ECG of 03/06/2019, QT has shortened Premature ventricular complexes are now present Reconfirmed by Delora Fuel ([id number]) on 05/29/2019 3:00:44 AM   Radiology No results found.  Procedures Procedures  Medications Ordered in ED Medications  sodium chloride 0.9 % bolus 1,000 mL (has no administration in time range)  meclizine (ANTIVERT) tablet 25 mg (25 mg Oral Given 05/29/19 0326)    ED Course  I have reviewed the triage vital signs and the nursing notes.  Pertinent lab results that were available during my care of the patient were reviewed by me and considered in my medical decision making (see chart for details).  MDM Rules/Calculators/A&P Vertigo which has all the characteristics of peripheral vertigo.  No red flags to suggest central vertigo.  Probable episode of SVT which has resolved.  ECG shows borderline elevated heart rate and occasional PVC.  Old records are reviewed, and she had a recent echocardiogram showing bicuspid aortic valve and mitral valve prolapse.  Will check electrolytes and treat empirically with meclizine.  She also be given IV fluids.  She feels much better after above-noted treatment.  Labs are reassuring.  She is discharged with a prescription for meclizine.  Final Clinical Impression(s) / ED Diagnoses Final diagnoses:  Peripheral vertigo, unspecified laterality    Rx / DC Orders ED Discharge Orders    None       Delora Fuel, MD 73/53/29 [phone]

## 2019-05-29 NOTE — ED Triage Notes (Signed)
C/o "vertigo" since about 2300 last night, and noted her smart watch showed a HR of 150 just PTA. Pt has been f/u with cardiologist who recommended a cardiac MRI that she has not yet had. Also reports shaking, and SHOB.

## 2023-08-11 NOTE — Progress Notes (Signed)
 [facility]  [facility] Name: Stacey Chaney Date of Birth: 1975/05/01 MRN: [id number] Visit Date: 08/11/2023  Subjective:    Stacey Chaney is a 48 y.o. female who presents for CPE or Wellness Visit. Issues today include:  Chief Complaint  Patient presents with  . Annual Exam  . Depression  . Anxiety  . Iron deficiency anemia    History of Present Illness The patient presents for a physical exam, management of a urinary tract infection (UTI), anemia, and tennis elbow.  She is currently experiencing a urinary tract infection, which she believes is not yet severe. Her urine shows trace blood and positive leukocytes, but she notes that she is menstruating at present. She is willing to complete an urine culture.   She has been managing her anemia with B12 supplements, multivitamins, and iron. She takes an iron supplement a few times a week, which unfortunately induces nausea and constipation. She is curious about the current status of her condition.  She reports no lymphedema or swelling. 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 This has led to some neurological issues, although she retains full sensation and can slightly wiggle her toes. Despite these challenges, she maintains independence, including driving and performing all necessary activities.  She is up to date on her Pap smear. She is taking omega-3s. She had allergies about a month ago when the pollen started, but now it is fine. She needs her Lexapro refilled and her mammogram needs to be ordered.  She suspects she may have tennis elbow or a repetitive motion injury, as evidenced by her elbows being shut off. She is wheel chair bound and she is making reference to overuse while wheeling her chair around. She occasionally uses a compression sleeve, which provides enough relief to prevent significant swelling. She is considering  steroid injections if the condition worsens, but currently, it does not impede her daily activities.  GYNECOLOGICAL HISTORY: - Last Menstrual Period: 07/2023  PAST SURGICAL HISTORY: - Coarctation repair at age 72  SOCIAL HISTORY She has 3 children.   Results for orders placed or performed in visit on 08/10/23  Anemia Profile   Collection Time: 08/10/23  7:51 AM  Result Value Ref Range   Iron 78 50 - 212 ug/dL   Transferrin 769 796 - 362 mg/dL   Ferritin 41 11 - 692 ng/mL   Total Iron Binding Capacity (TIBC) 329 290 - 518 ug/dL   Transferrin Saturation 24 15 - 45 %  Comprehensive Metabolic Panel   Collection Time: 08/10/23  7:51 AM  Result Value Ref Range   Sodium 139 136 - 145 mmol/L   Potassium 3.8 3.5 - 5.1 mmol/L   Chloride 106 98 - 107 mmol/L   CO2 27 21 - 31 mmol/L   Anion Gap 6 6 - 14 mmol/L   Glucose, Random 87 70 - 99 mg/dL   Blood Urea Nitrogen (BUN) 14 7 - 25 mg/dL   Creatinine 9.57 (L) 9.39 - 1.20 mg/dL   eGFR >09 >40 fO/fpw/8.26f7   Albumin 4.0 3.5 - 5.7 g/dL   Total Protein 6.4 6.4 - 8.9 g/dL   Bilirubin, Total 0.3 0.3 - 1.0 mg/dL   Alkaline Phosphatase (ALP) 46 34 - 104 U/L   Aspartate Aminotransferase (AST) 12 (L) 13 - 39 U/L   Alanine Aminotransferase (ALT) 7 7 - 52 U/L   Calcium 8.7 8.6 -  10.3 mg/dL   BUN/Creatinine Ratio 33.3 (H) 10.0 - 20.0  Lipid Panel With Reflex Direct LDL   Collection Time: 08/10/23  7:51 AM  Result Value Ref Range   Cholesterol, Total, Lipid Panel 152 <200 mg/dL   Triglycerides, Lipid Panel 100 <150 mg/dL   HDL Cholesterol - Lipid Panel 63 >=60 mg/dL   LDL Cholesterol, Calculated 70 <100 mg/dL   Non-HDL Cholesterol 89 mg/dL  TSH   Collection Time: 08/10/23  7:51 AM  Result Value Ref Range   TSH 1.554 0.450 - 5.330 uIU/mL  Urinalysis with Microscopic   Collection Time: 08/10/23  7:51 AM  Result Value Ref Range   Color, Urine Light Yellow Yellow   Clarity, Urine Cloudy (A) Clear   Specific Gravity, Urine 1.018 1.005 -  1.025   pH, Urine 6.0 5.0 - 8.0   Protein, Urine Negative Negative mg/dL   Glucose, Urine Negative Negative mg/dL   Ketones, Urine Negative Negative mg/dL   Bilirubin, Urine Negative Negative   Blood, Urine Trace (A) Negative   Nitrite, Urine Negative Negative   Leukocyte Esterase, Urine 500 (A) Negative, 25   Urobilinogen, Urine Normal <2.0 mg/dL   WBC, Urine >49 (A) <6 /HPF   RBC, Urine 0-2 0 - 2 /HPF   Bacteria, Urine Occasional (A) None Seen, Rare /HPF   Squamous Epithelial Cells, Urine 0-5 0 - 5 /HPF  CBC with Differential   Collection Time: 08/10/23  7:51 AM  Result Value Ref Range   WBC 3.50 (L) 4.40 - 11.00 10*3/uL   RBC 3.68 (L) 4.10 - 5.10 10*6/uL   Hemoglobin 12.4 12.3 - 15.3 g/dL   Hematocrit 63.7 64.0 - 44.6 %   Mean Corpuscular Volume (MCV) 98.2 (H) 80.0 - 96.0 fL   Mean Corpuscular Hemoglobin (MCH) 33.6 (H) 27.5 - 33.2 pg   Mean Corpuscular Hemoglobin Conc (MCHC) 34.2 33.0 - 37.0 g/dL   Red Cell Distribution Width (RDW) 13.0 12.3 - 17.0 %   Platelet Count (PLT) 198 150 - 450 10*3/uL   Mean Platelet Volume (MPV) 8.4 6.8 - 10.2 fL   Neutrophils % 47 %   Lymphocytes % 41 %   Monocytes % 7 %   Eosinophils % 4 %   Basophils % 1 %   nRBC % 0 %   Neutrophils Absolute 1.60 (L) 1.80 - 7.80 10*3/uL   Lymphocytes # 1.40 1.00 - 4.80 10*3/uL   Monocytes # 0.30 0.00 - 0.80 10*3/uL   Eosinophils # 0.10 0.00 - 0.50 10*3/uL   Basophils # 0.00 0.00 - 0.20 10*3/uL   nRBC Absolute 0.00 <=0.00 10*3/uL     No results found.  Immunization History  Administered Date(s) Administered  . TD vaccine (ADULT)PF(TENIVAC) 7Y+ 06/06/2019  . TDAP VACCINE (BOOSTRIX,ADACEL) 7Y+ 12/06/2008    Objective:   Allergies  Allergen Reactions  . Ceftriaxone  Other (See Comments)    Severe hypotension  . Chocolate Nausea And Vomiting    Headache     Current Outpatient Medications:  .  biotin (Hair, Skin and Nails, biotin,) 10,000 mcg chew, biotin, Disp: , Rfl:  .  cyanocobalamin,  vitamin B-12, (VITAMIN B-12 ORAL), Take by mouth., Disp: , Rfl:  .  magnesium 30 mg tab, Take 60 mg by mouth nightly., Disp: , Rfl:  .  miscellaneous medical supply (C-Tub) misc, Dispense 1 manual ultra lightweight wheelchair for physical therapy-occupational therapy evaluation and treat Dx:G82.20, Disp: 1 each, Rfl: 0 .  multivitamin (THERAGRAN) tab tablet, Take 1 tablet by mouth Once  Daily., Disp: , Rfl:  .  omega-3 fatty acids-fish oil (FISH OIL) 1,000 mg cap capsule, Take 2 capsules by mouth Once Daily., Disp: , Rfl:  .  UNABLE TO FIND, Med Name: Natural supplement, DIM., Disp: , Rfl:  .  escitalopram (LEXAPRO) 10 mg tablet, Take one tablet (10 mg total) by mouth daily., Disp: 90 tablet, Rfl: 0 .  nitrofurantoin, macrocrystal-monohydrate, (Macrobid) 100 mg capsule, Take one capsule (100 mg total) by mouth 2 (two) times a day for 5 days., Disp: 10 capsule, Rfl: 0  Patient Active Problem List  Diagnosis  . Paraplegia (HCC)  . Generalized anxiety disorder with panic attacks  . Chronic neck and back pain  . Chronic pain of both shoulders  . PSVT (paroxysmal supraventricular tachycardia)  . Coarctation of aorta  . Bicuspid aortic valve  . Right ovarian cyst  . Iron deficiency anemia  . Other neutropenia  . S/P repair of coarctation of aorta  . Shone complex  . Macrocytosis without anemia    Past Medical History:  Diagnosis Date  . ADHD (attention deficit hyperactivity disorder)   . Allergic   . Anemia   . Anxiety   . Arthritis   . Depression   . Scoliosis   . Urinary tract infection       Past Surgical History:  Procedure Laterality Date  . CARDIAC SURGERY  1982  . CESAREAN SECTION, CLASSICAL     3  . REDUCTION MAMMAPLASTY    . TUBAL LIGATION  2008    Family History  Problem Relation Name Age of Onset  . Alcohol abuse Father Father   . Chromosomal disorder Daughter Jinnie   . Ovarian cancer Neg Hx    . Breast cancer Neg Hx      BP 121/80   Pulse 82   Ht 1.575  m (5' 2)   Wt 65.3 kg (144 lb)   SpO2 98%   BMI 26.34 kg/m   Review of Systems Systemic:  Not feeling tired or poorly. No fever or no chills. Head:  No headache. Eyes:  No vision problems. Otolaryngeal:  No earache and no sore throat. Cardiovascular:  No chest pain or discomfort and no palpitations. Pulmonary:  No dyspnea and no cough. Gastrointestinal:  No vomiting, no abdominal pain, and no melena. Genitourinary:  No dysuria. Musculoskeletal:  No localized joint pain and no localized joint swelling. Neurological:  No motor disturbances and no sensory disturbances. Psychological:  No anxiety and no depression. Skin:  No skin lesions or rash.  Physical Exam GENERAL APPEARANCE:  Well developed, well groomed.  No acute distress. Color good. MENTAL STATUS: Appears alert and oriented. Affect appropriate. SKIN: Skin color and turgor normal. No suspicious lesions, masses, rashes, or ulcerations. Nails and hair appear normal. HEAD: Normocephalic. EARS: External ear w/o  masses or lesions. External auditory canal intact, clear, and w/o lesions. TMs intact with normal light reflex and landmarks. Acuity to conversational tones good. EYES: PERRL, extraocular movements intact. Lids w/o defect, conjunctiva and sclera appear normal.  NOSE: Nasal mucosa and turbinates pink,  no lesions. OROPHARYNX: Moist w/o exudate, erythema, or swelling. Gums pink w/o lesions. Normal appearing mucosa, palate, and tongue.  NECK: Full ROM w/o pain or tenderness. Thyroid nontender w/o enlargement or masses.  Carotids with no bruits. No cervical lymphadenopathy. CHEST: Respirations unlabored with normal diaphragmatic excursion. Chest wall symmetric with no masses. Breath sounds clear bilaterally w/o wheezes, rubs, rales, or rhonchi. CV:. Normal S1 and S2 w/o murmur, rub, gallop,  or click.  No edema, clubbing, or cyanosis.  Distal pulses full and symmetrical. GI/ABDOMEN: Abdomen soft with normal bowel sounds. No  guarding or rebound. No palpable masses or tenderness. BREASTS: Deferred GU: Deferred MS: Muscle tone and strength normal for age, w/o atrophy or abnormal movement. EXTREMITIES:  No obvious joint deformities or effusions. NEUROLOGICAL: Cranial nerves II-XII grossly intact. Motor strength symmetrical with no obvious weaknesses. Superficial sensation intact. Observed dexterity w/o ataxia or tremor.    Assessment/Plan:   1. Encounter for screening mammogram for malignant neoplasm of breast  [facility] Breast Screening [facility]    2. Need for hepatitis C screening test      3. Situational mixed anxiety and depressive disorder  escitalopram (LEXAPRO) 10 mg tablet    4. Acute cystitis without hematuria  nitrofurantoin, macrocrystal-monohydrate, (Macrobid) 100 mg capsule    5. Acute cystitis with hematuria  Urine Culture     Assessment & Plan 1. Urinary Tract Infection. - Urine analysis shows trace blood and positive leukocytes, indicating a possible UTI. - A prescription for Macrobid 100 mg will be sent to the pharmacy, to be taken twice daily for the next 5 days. - A urine culture will be conducted today to ensure the antibiotic's effectiveness. - If symptoms do not resolve within a week, she should inform the office for potential antibiotic adjustment based on culture results.  2. Anemia. - Hemoglobin level remains around 12, but the MCV value is improving. - Iron levels are within the low normal range. - Continued regimen of B12 and iron supplements is advised. - She reports taking iron supplements a few times a week due to gastrointestinal side effects.  3. Paraplegia. 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 - Complete sensation is present, with slight ability to wiggle toes. - No spinal cord involvement, but some communication issues persist.  4. Tennis Elbow. - Reports symptoms consistent with tennis elbow or  repetitive motion injury. - Uses a compression sleeve for relief. - If symptoms worsen, she may consider seeing an orthopedic specialist for potential steroid injection. - Currently, symptoms are manageable and not preventing daily activities.  5. Health Maintenance. - Up to date on Pap smear, last done in 2024. - Thyroid function is normal, and HDL levels have improved significantly with omega-3 supplementation. - Kidney function is excellent. - Eligible for a pneumonia vaccine. - A mammogram will be ordered.    FOLLOWUP PLAN: 6 months depression  Orders Placed This Encounter  Procedures  . Urine Culture  . MG Breast Screening Tomo Bilat    Requested Prescriptions   Signed Prescriptions Disp Refills  . escitalopram (LEXAPRO) 10 mg tablet 90 tablet 0    Sig: Take one tablet (10 mg total) by mouth daily.  . nitrofurantoin, macrocrystal-monohydrate, (Macrobid) 100 mg capsule 10 capsule 0    Sig: Take one capsule (100 mg total) by mouth 2 (two) times a day for 5 days.      Risks, benefits, and alternatives of the medication(s) and treatment plan(s) were discussed, and she expressed understanding. Plan follow up as discussed or as needed. No barriers to treatment identified in this visit.

## 2023-09-15 NOTE — Progress Notes (Signed)
 Patient and family brought back to exam room from lobby. Identified patient with name and date of birth. Name/Age/MRN/Gender entered into machine. Instructed patient and family that the procedure is painless and will measure the hearts electrical activity. Answered questions with verbal understanding.  Kindly, asked patient to remove shirt and lie down on exam table.  Ten electrodes were placed to patients bare chest then ten electrical leads were attached to each electrode. Asked patient to lie as still as possible. Printed ecg after green light indicator. The electrical pads and electrodes were removed without difficulty. Ecg handed to cardiology provider.   Daine Earnie Hug, CMA

## 2023-09-30 NOTE — Progress Notes (Signed)
 Orthopedic Surgery Office Note  Chief Complaint  Patient presents with  . Left Elbow - Pain     History of Present Illness: 48 year old female presents to clinic with several months of left lateral elbow pain.  She denies any traumatic event.  She denies any feelings of instability or paresthesias.  Most of pain is located over the lateral epicondyle and extends down the common extensor tendon.  Of note she is a paraplegic in a wheelchair with significant overuse.  Prior Treatment: Oral anti-inflammatories, lateral epicondyle strap  History and Physical standard intake form from today's or previous visit is reviewed, pertinent information is noted and otherwise scanned into the patient's permanent medical record for future use.    Review of Systems:  Negative unless otherwise specified in HPI   Physical Exam:   General:  Alert and oriented x 3, NAD, well kempt, and of stated age.   Vital Signs:  BP 122/78 (BP Location: Right arm, Patient Position: Sitting)   Pulse 86   Ht 1.575 m (5' 2)   Wt 65.3 kg (144 lb)   BMI 26.34 kg/m  , Body mass index is 26.34 kg/m.   HEENT:  Head is normocephalic and atraumatic. Extraocular muscles are grossly intact. Pupils are equal, round, and reactive to light and accommodation. Nares appeared normal. Mucous membranes are moist. Neck:  Supple, no visible swelling. Cardiovascular:  Hemodynamically stable. Respiratory:  No audible wheezing, unlabored respirations. Abdomen:  Soft, non-distended. Neurological:  Cranial nerves II through XII appear grossly intact.  Skin:  Clean and dry, well kempt. Psychiatric:  Good affect, stable, cooperative Extremities:  Pink without cyanosis, bilaterally.  Orthopedic Exam:   Tender to palpation of the lateral epicondyles down the common extensor Hand is well-perfused with good cap refill 2+ radial and ulnar pulses 5 out of 5 strength in all muscle distributions, silt Pain with resisted supination and resisted  extension.  Assessment:    Tennis elbow.  At this time the patient is deferring any injection  Plan:    Tennis elbow stretches and exercises RICE, Tylenol , and salves as needed  Medical and Social reviewed: Paraplegic  Follow up: As needed  Test Results X-rays of the left elbow show no acute fracture malalignment joint space maintained   Labs  Lab Results  Component Value Date   WBC 3.50 (L) 08/10/2023   HGB 12.4 08/10/2023   HCT 36.2 08/10/2023   PLT 198 08/10/2023    Lab Results  Component Value Date   NA 139 08/10/2023   K 3.8 08/10/2023   CL 106 08/10/2023   CO2 27 08/10/2023   BUN 14 08/10/2023   CREATININE 0.42 (L) 08/10/2023   CALCIUM 8.7 08/10/2023    Lab Results  Component Value Date   BILITOT 0.3 08/10/2023   PROT 6.4 08/10/2023   ALBUMIN 4.0 08/10/2023   ALT 7 08/10/2023   AST 12 (L) 08/10/2023    No results found for: LABPROT, INR, APTT  Allergies  Ceftriaxone  and Chocolate   Medications    Current Medications[1]   Past Medical History  Past medical history reviewed and otherwise negative unless stated below.  Medical History[2]   Past Surgical History  Past surgical history reviewed and otherwise negative unless stated below.  Surgical History[3]   Family History  Family history reviewed and otherwise negative unless stated below.  Family History[4]   Social History:  Social history reviewed and otherwise negative unless stated below.  Social History   Socioeconomic History  .  Marital status: Legally Separated    Spouse name: Not on file  . Number of children: Not on file  . Years of education: Not on file  . Highest education level: Not on file  Occupational History  . Not on file  Tobacco Use  . Smoking status: Former    Current packs/day: 0.00    Types: Cigarettes    Passive exposure: Never  . Smokeless tobacco: Never  Vaping Use  . Vaping status: Never Used  Substance and Sexual Activity  .  Alcohol use: Yes    Alcohol/week: 5.0 standard drinks of alcohol    Types: 5 Glasses of wine per week    Comment: Occ  . Drug use: Never  . Sexual activity: Yes    Partners: Male    Birth control/protection: Surgical, None  Other Topics Concern  . Not on file  Social History Narrative  . Not on file   Social Drivers of Health   Food Insecurity: Low Risk  (08/11/2023)   Food vital sign   . Within the past 12 months, you worried that your food would run out before you got money to buy more: Never true   . Within the past 12 months, the food you bought just didn't last and you didn't have money to get more: Never true  Transportation Needs: No Transportation Needs (08/11/2023)   Transportation   . In the past 12 months, has lack of reliable transportation kept you from medical appointments, meetings, work or from getting things needed for daily living? : No  Safety: Low Risk  (08/11/2023)   Safety   . How often does anyone, including family and friends, physically hurt you?: Never   . How often does anyone, including family and friends, insult or talk down to you?: Never   . How often does anyone, including family and friends, threaten you with harm?: Never   . How often does anyone, including family and friends, scream or curse at you?: Never  Living Situation: Low Risk  (08/11/2023)   Living Situation   . What is your living situation today?: I have a steady place to live   . Think about the place you live. Do you have problems with any of the following? Choose all that apply:: None/None on this list         Problem List  Problem List[5]         [1] Current Outpatient Medications  Medication Sig Dispense Refill  . biotin (Hair, Skin and Nails, biotin,) 10,000 mcg chew biotin    . cyanocobalamin, vitamin B-12, (VITAMIN B-12 ORAL) Take by mouth.    . escitalopram (LEXAPRO) 10 mg tablet Take one tablet (10 mg total) by mouth daily. 90 tablet 0  . magnesium 30 mg tab Take  60 mg by mouth nightly.    . miscellaneous medical supply (C-Tub) misc Dispense 1 manual ultra lightweight wheelchair for physical therapy-occupational therapy evaluation and treat Dx:G82.20 1 each 0  . multivitamin (THERAGRAN) tab tablet Take 1 tablet by mouth Once Daily.    SABRA omega-3 fatty acids-fish oil (FISH OIL) 1,000 mg cap capsule Take 2 capsules by mouth Once Daily.    SABRA UNABLE TO FIND Med Name: Natural supplement, DIM.     No current facility-administered medications for this visit.  [2] Past Medical History: Diagnosis Date  . ADHD (attention deficit hyperactivity disorder)   . Allergic   . Anemia   . Anxiety   . Arthritis   .  Depression   . Scoliosis   . Urinary tract infection   [3] Past Surgical History: Procedure Laterality Date  . CARDIAC SURGERY  1982  . CESAREAN SECTION, CLASSICAL     3  . REDUCTION MAMMAPLASTY    . TUBAL LIGATION  2008  [4] Family History Problem Relation Name Age of Onset  . Alcohol abuse Father Father   . Chromosomal disorder Daughter Jinnie   . Ovarian cancer Neg Hx    . Breast cancer Neg Hx    [5] Patient Active Problem List Diagnosis  . Paraplegia (HCC)  . Generalized anxiety disorder with panic attacks  . Chronic neck and back pain  . Chronic pain of both shoulders  . PSVT (paroxysmal supraventricular tachycardia)  . Coarctation of aorta  . Bicuspid aortic valve  . Right ovarian cyst  . Iron deficiency anemia  . Other neutropenia  . S/P repair of coarctation of aorta  . Shone complex  . Macrocytosis without anemia

## 2023-10-23 ENCOUNTER — Ambulatory Visit: Attending: Family Medicine | Admitting: Physical Therapy

## 2023-10-23 DIAGNOSIS — M6281 Muscle weakness (generalized): Secondary | ICD-10-CM | POA: Insufficient documentation

## 2023-10-23 DIAGNOSIS — G8222 Paraplegia, incomplete: Secondary | ICD-10-CM | POA: Insufficient documentation

## 2023-10-23 DIAGNOSIS — R2689 Other abnormalities of gait and mobility: Secondary | ICD-10-CM | POA: Insufficient documentation

## 2023-10-23 NOTE — Therapy (Signed)
 OUTPATIENT PHYSICAL THERAPY WHEELCHAIR EVALUATION   Patient Name: Stacey Chaney MRN: [id number] DOB:Aug 26, 1975, 48 y.o., female Today's Date: 10/23/2023  END OF SESSION:  PT End of Session - 10/23/23 1444     Visit Number 1    Number of Visits 1    Date for PT Re-Evaluation 10/23/23    Authorization Type BCBS    PT Start Time 1443    PT Stop Time 1528    PT Time Calculation (min) 45 min    Activity Tolerance Patient tolerated treatment well    Behavior During Therapy WFL for tasks assessed/performed          Past Medical History:  Diagnosis Date   Anxiety    Aortic stenosis    Heart murmur    Mitral valve prolapse    Paraplegia (HCC)    since age 69   Past Surgical History:  Procedure Laterality Date   AORTA SURGERY     CESAREAN SECTION     TUBAL LIGATION     There are no active problems to display for this patient.   PCP: Stacey Sharper, MD  REFERRING PROVIDER: Sharl Tully HERO, PA-C  THERAPY DIAG:  Muscle weakness (generalized)  Other abnormalities of gait and mobility  Paraplegia, incomplete (HCC)  Rationale for Evaluation and Treatment Habilitation  SUBJECTIVE:                                                                                                                                                                                           SUBJECTIVE STATEMENT: Pt presents for a new manual wheelchair evaluation. She reports that she received her current manual wheelchair about 2-3 years ago from [place], is not happy with her current wheelchair. She reports that her current wheelchair is not holding up well for her daily needs. Pt has frequent lower back pain and spasms that is exacerbated by her current chair setup. Pt reports that she became paraplegic in 1982 following a surgery. Pt reports that she has full sensation throughout her lower body, just has minimal movement in her legs (is able to wiggle toes on L side).  Accompanied by husband,  Stacey Chaney  PRECAUTIONS: Fall   RED FLAGS:None   WEIGHT BEARING RESTRICTIONS No    OCCUPATION: digital designer  PLOF:  Independent with household mobility with device, Independent with community mobility with device, Independent with transfers, and Requires assistive device for independence  PATIENT GOALS: to obtain a new manual wheelchair to allow for safe and independent mobility and ability to complete MRADLs in a safe and timely manner           MEDICAL  HISTORY:  Primary diagnosis onset: 1982     Medical Diagnosis with ICD-10 code: G82.2: paraplegia, unspecified   [] Progressive disease  Relevant future surgeries: N/A    Height: 5'2 Weight: 140 lbs Explain recent changes or trends in weight:  N/A    History:  Past Medical History:  Diagnosis Date   Anxiety    Aortic stenosis    Heart murmur    Mitral valve prolapse    Paraplegia (HCC)    since age 29       Cardio Status:  Functional Limitations: impaired due to mobility impairments, history of tachycardia, aortic stenosis, heart murmur, mitral valve prolapse, coarctation repair  [] Intact  [x]  Impaired      Respiratory Status:  Functional Limitations:   [x] Intact  [] Impaired   [] SOB [] COPD [] O2 Dependent ______LPM  [] Ventilator Dependent  Resp equip:                                                     Objective Measure(s):   Orthotics:   [] Amputee:                                                             [] Prosthesis:        HOME ENVIRONMENT:  [x] House [] Condo/town home [] Apartment [] Asst living [] LTCF         [x] Own  [] Rent   [] Lives alone [x] Lives with others -        husband                     Hours without assistance: 8-12 hours  [x] Home is accessible to patient: single story home                                 Storage of wheelchair:  [x] In home   [] Other Comments:        COMMUNITY :  TRANSPORTATION:  [x] Car [] Scientist, physiological [] Adapted w/c Lift []  Ambulance [] Other:                      [] Sits in wheelchair during transport   Where is w/c stored during transport? Inside car (patient breaks down chair and sets it in her car) [] Tie Downs  []  EZ Lock  r   [] Self-Driver       Drive while in  Biomedical scientist [] yes [x] no   Employment and/or school: works as a Therapist, art pertaining to mobility: needs to be able to access her work environment from a wheelchair level       Other:  COMMUNICATION:  Verbal Communication  [x] WFL [] receptive [x] WFL [] expressive [] Understandable  [] Difficult to understand  [] non-communicative  Primary Language:________English______ 2nd:_____________  Communication provided by:[x] Patient [] Family [] Caregiver [] Translator   [] Uses an augmentative communication device     Manufacturer/Model :  MOBILITY/BALANCE:  Sitting Balance  Standing Balance  Transfers  Ambulation   [x] WFL      [] WFL  [x] Independent  []  Independent   [] Uses UE for balance in sitting Comments:  [] Uses UE/device for stability Comments:  []  Min assist  []  Ambulates independently with       device:___________________      []  Mod assist  []  Able to ambulate ______ feet        safely/functionally/independently   []  Min assist  []  Min assist  []  Max assist  []  Non-functional ambulator         History/High risk of falls   []  Mod assist  []  Mod assist  []  Dependent  [x]  Unable to ambulate   []  Max  assist  []  Max assist  Transfer method:[] 1 person [] 2 person [] sliding board [x] squat pivot [] stand pivot [] mechanical patient lift  [] other:   []  Unable  [x]  Unable    Fall History: # of falls in the past 6 months? 0 # of "near" falls in the past 6 months? 0    CURRENT SEATING / MOBILITY:  Current Mobility Device: [] None [] Cane/Walker [x] Manual [] Dependent [] Dependent w/ Tilt rScooter  [] Power (type of control):   Manufacturer: Motion Composite Model: Apex C Serial #:   Size:  Color:  Age: 3-3 years   Purchased by whom: patient  Current condition of mobility base:  shows age-related wear and tear; side guards have split in half, seat is no longer rigid  Current seating system:                    Motion Composite rigid frame                                                   Age of seating system:  2-3 years  Describe posture in present seating system: Pt exhibits increased thoracic kyphosis and posterior pelvic tilt while seated in her current system.   Is the current mobility meeting medical necessity?:  [] Yes [x] No Describe: Patient's current seat is not supportive and has sunken down in the middle which increases her low back pain and spasticity in her lower back. Over time the straps to tighten this seat have become stretched out and the seat is no longer supportive. She also is no longer able to use the side guards because they have split in half so they are currently unusable.                                    Ability to complete Mobility-Related Activities of Daily Living (MRADL's) with Current Mobility Device:   Move room to room  [x] Independent  [] Min [] Mod [] Max assist  [] Unable  Comments:   Meal prep  [x] Independent  [] Min [] Mod [] Max assist  [] Unable    Feeding  [x] Independent  [] Min [] Mod [] Max assist  [] Unable    Bathing  [x] Independent  [] Min [] Mod [] Max assist  [] Unable    Grooming  [x] Independent  [] Min [] Mod [] Max assist  [] Unable    UE dressing  [x] Independent  [] Min [] Mod [] Max assist  [] Unable    LE dressing  [x] Independent   [] Min [] Mod [] Max assist  [] Unable    Toileting  [x] Independent  [] Min [] Mod [] Max assist  [] Unable  Bowel Mgt: [x]  Continent []  Incontinent []  Accidents []  Diapers []  Colostomy []  Bowel Program:  Bladder Mgt: [x]  Continent []  Incontinent []  Accidents []  Diapers []  Urinal []  Intermittent Cath []  Indwelling Cath []  Supra-pubic Cath     Current Mobility Equipment Trialed/ Ruled Out:    Does not meet mobility needs due to:    Oneil all boxes  that indicate inability to use the specific equipment listed     Meets needs for safe  independent functional  ambulation  / mobility    Risk of  Falling or History of Falls    Enviromental limitations      Cognition    Safety concerns with  physical ability    Decreased / limitations endurance  & strength     Decreased / limitations  motor skills  & coordination    Pain    Pace /  Speed    Cardiac and/or  respiratory condition    Contra - indicated by diagnosis   Cane/Crutches  []   [x]   []   []   [x]   [x]   [x]   []   []   []   [x]    Walker / Rollator  []  NA   []   [x]   []   []   [x]   [x]   [x]   []   []   []   [x]     Manual Wheelchair [id number]:  []  NA  [x]   []   []   []   []   []   []   []   []   []   []    Manual W/C (K0005) with power assist  [x]  NA  []   []   []   []   []   []   []   []   []   []   []    Scooter  [x]  NA  []   []   []   []   []   []   []   []   []   []   []    Power Wheelchair: standard joystick  [x]  NA  []   []   []   []   []   []   []   []   []   []   []    Power Wheelchair: alternative controls  [x]  NA  []   []   []   []   []   []   []   []   []   []   []    Summary:  The least costly alternative for independent functional mobility was found to be:    []  Crutch/Cane  []  Walker [x]  Manual w/c  []  Manual w/c with power assist   []  Scooter   []  Power w/c std joystick   []  Power w/c alternative control        []  Requires dependent care mobility device   Cabin crew for [employer] skills are adequate for safe mobility equipment operation  [x]   Yes []   No  Patient is willing and motivated to use recommended mobility equipment  [x]   Yes []   No       []  Patient is unable to safely operate mobility equipment independently and requires dependent care equipment Comments:           SENSATION and SKIN ISSUES:  Sensation [x]  Intact  []  Impaired []  Absent []  Hyposensate []  Hypersensate  []  Defensiveness  Location(s) of impairment:    Pressure Relief Method(s):  []  Lean side to side to  offload (without risk of falling)  [x]   W/C push up (4+ times/hour for 15+ seconds) []  Stand up (without risk of falling)    []  Other: (Describe): Effective pressure relief method(s) above can be performed consistently throughout the day: [x] Yes  []   No If not, Why?:  Skin Integrity Risk:       []  Low risk           [x]  Moderate risk            []  High risk  If high risk, explain:   Skin Issues/Skin Integrity  Current skin Issues  []  Yes [x]  No []  Intact  []   Red area   []   Open area  []  Scar tissue  []  At risk from prolonged sitting  Where: History of Skin Issues  []  Yes [x]  No Where : When: Stage: Hx of skin flap surgeries  []  Yes [x]  No Where:  When:  Pain: [x]  Yes []  No   Pain Location(s): in sacrum from prolonged sitting, in low back with spasms Intensity scale: (0-10) : not rated How does pain interfere with mobility and/or MRADLs? - patient unable to tolerate sitting for extened periods of time in her current manual wheelchair which means she is unable to perform her MRADLs        MAT EVALUATION:  Neuro-Muscular Status: (Tone, Reflexive, Responses, etc.)     []   Intact   [x]  Spasticity: in low back []  Hypotonicity  []  Fluctuating  []  Muscle Spasms  []  Poor Righting Reactions/Poor Equilibrium Reactions  []  Primal Reflex(s):    Comments:            COMMENTS:    POSTURE:     Comments:  Pelvis Anterior/Posterior:  []  Neutral   [x]  Posterior  []  Anterior  []  Fixed - No movement [x]  Tendency away from neutral []  Flexible []  Self-correction []  External correction Obliquity (viewed from front)  [x]  WFL []  R Obliquity []  L Obliquity  []  Fixed - No movement []  Tendency away from neutral []  Flexible []  Self-correction []  External correction Rotation  [x]  WFL []  R anterior []  L anterior  []  Fixed - No movement []  Tendency away from neutral []  Flexible []  Self-correction []  External correction Tonal Influence Pelvis:  [x]  Normal []   Flaccid []  Low tone []  Spasticity []  Dystonia []  Pelvis thrust []  Other:    Trunk Anterior/Posterior:  []  WFL [x]  Thoracic kyphosis []  Lumbar lordosis  []  Fixed - No movement []  Tendency away from neutral [x]  Flexible [x]  Self-correction []  External correction  [x]  WFL []  Convex to left  []  Convex to right []  S-curve   []  C-curve []  Multiple curves []  Tendency away from neutral []  Flexible []  Self-correction []  External correction Rotation of shoulders and upper trunk:  [x]  Neutral []  Left-anterior []  Right- anterior []  Fixed- no movement []  Tendency away from neutral []  Flexible []  Self correction []  External correction Tonal influence Trunk:  [x]  Normal []  Flaccid []  Low tone []  Spasticity []  Dystonia []  Other:   Head & Neck  [x]  Functional []  Flexed    []  Extended []  Rotated right  []  Rotated left []  Laterally flexed right []  Laterally flexed left []  Cervical hyperextension   [x]  Good head control []  Adequate head control []  Limited head control []  Absent head control Describe tone/movement of head and neck: [facility]     Lower Extremity Measurements: LE ROM:  Passive ROM Right 10/23/2023 Left 10/23/2023  Hip flexion Tight hip flexors from prolonged sitting Tight hip flexors from prolonged sitting  Hip extension    Hip abduction    Hip adduction    Knee flexion [facility] WFL  Knee extension Tight HS Tight HS  Ankle dorsiflexion Tight gastroc Tight gastroc  Ankle plantarflexion     (  Blank rows = not tested)  LE MMT:  MMT Right 10/23/2023 Left 10/23/2023  Hip flexion 0 0  Hip extension    Hip abduction    Hip adduction    Knee flexion 0 1  Knee extension 0 0  Ankle dorsiflexion 0 0  Ankle plantarflexion     (Blank rows = not tested)  Hip positions:  [x]  Neutral   []  Abducted   []  Adducted  []  Subluxed   []  Dislocated   []  Fixed   []  Tendency away from neutral []  Flexible []  Self-correction []  External correction   Hip Windswept:[x]   Neutral  []  Right    []  Left  []  Subluxed   []  Dislocated   []  Fixed   []  Tendency away from neutral []  Flexible []  Self-correction []  External correction  LE Tone: []  Normal []  Low tone [x]  Spasticity []  Flaccid []  Dystonia []  Rocks/Extends at hip []  Thrust into knee extension []  Pushes legs downward into footrest  Foot positioning: ROM Concerns: Dorsiflexed: []  Right   []  Left Plantar flexed: []  Right    []  Left Inversion: []  Right    []  Left Eversion: []  Right    []  Left  LE Edema: []  1+ (Barely detectable impression when finger is pressed into skin) []  2+ (slight indentation. 15 seconds to rebound) []  3+ (deeper indentation. 30 seconds to rebound) []  4+ (>30 seconds to rebound)  UE Measurements:  UPPER EXTREMITY ROM:   Active ROM Right 10/23/2023 Left 10/23/2023  Shoulder flexion [facility]  Shoulder abduction [facility]  Shoulder adduction    Elbow flexion [facility] WFL  Elbow extension [facility]  Wrist flexion    Wrist extension    (Blank rows = not tested)  UPPER EXTREMITY MMT:  MMT Right 10/23/2023 Left 10/23/2023  Shoulder flexion [facility]  Shoulder abduction [facility]  Shoulder adduction    Elbow flexion WFL WFL  Elbow extension [facility] WFL  Wrist flexion    Wrist extension    Pinch strength    Grip strength WFL WFL  (Blank rows = not tested)  Shoulder Posture:  Right Tendency towards Left  [x]   Functional [x]    []   Elevation []    []   Depression []    []   Protraction []    []   Retraction []    []   Internal rotation []    []   External rotation []    []   Subluxed []     UE Tone: [x]  Normal []  Flaccid []  Low tone []  Spasticity  []  Dystonia []  Other:   UE Edema: []  1+ (Barely detectable impression when finger is pressed into skin) []  2+ (slight indentation. 15 seconds to rebound) []  3+ (deeper indentation. 30 seconds to rebound) []  4+ (>30 seconds to rebound)  Wrist/Hand: Handedness: []  Right   []  Left   []  NA: Comments:  Right  Left  [x]   WNL [x]     []   Limitations []    []   Contractures []    []   Fisting []    []   Tremors []    []   Weak grasp []    []   Poor dexterity []    []   Hand movement non functional []    []   Paralysis []         MOBILITY BASE RECOMMENDATIONS and JUSTIFICATION:  MOBILITY BASE  JUSTIFICATION   Manufacturer:   TiLite Model:                Z  Color:  Seat Width:  16 Seat Depth:  16   [x]  Manual mobility base (continue below)   []  Scooter/POV  []  Power mobility base   Number of hours per day spent in above selected mobility base: 16 hours/day  Typical daily mobility base use Schedule: Patient will utilize her manual wheelchair to perform all of her functional mobility in the home. It will allow her to travel between rooms of her home safely and efficiently in order to complete all of her MRADLs. Pt is able to perform lateral scoot transfers mod I with her manual wheelchair . Due to her paraplegia she is not able to stand or ambulate and requires a manual wheelchair for all of her mobility.   [x]  is not a safe, functional ambulator  []  limitation prevents from completing a MRADL(s) within a reasonable time frame    []  limitation places at high risk of morbidity or mortality secondary to  the attempts to perform a    MRADL(s)  [x]  limitation prevents accomplishing a MRADL(s) entirely  [x]  provide independent mobility  [x]  equipment is a lifetime medical need  [x]  walker or cane inadequate  []  any type manual wheelchair      inadequate  []  scooter/POV inadequate      []  requires dependent mobility          MANUAL MOBILITY      []  Standard manual wheelchair  K0001      Arm:    []  both []  right  []  left      Foot:   []  both []  right   []  left  []  self-propels wheelchair  []  will use on regular basis  []  chair fits throughout home  []  willing and motivated to use  []  propels with assistance     []  dependent use   []  Standard hemi-manual wheelchair  K0002      Arm:    []  both []  right   []  left      Foot:   []  both []  right   []  left  []  lower seat height required to foot propel  []  short stature  []  self-propels wheelchair  []  will use on regular basis  []  chair fits throughout home  []  willing and motivated to use   []  propels with assistance  []  dependent use   []  Lightweight manual wheelchair  K0003      Arm:    []  both []  right  []  left      Foot:   []  both  []  right  []  left                   []  hemi height required  []  medical condition and weight of  wheelchair affect ability to self      propel standard manual wheelchair in the residence  []  can and does self-propel (marginal propulsion skills)  []  daily use _________hours  []  chair fits throughout home  []  willing and motivated to use  []  lower seat height required to foot propel  []  short stature   []  High strength lightweight manual  wheelchair (Breezy Ultra 4)  K0004     Arm:    []  both []  right  []  left     Foot:   []  both []  right   []  left                                                                  []   hemi height required []  medical condition and weight of wheelchair affect ability to self propel while engaging in frequent MRADL(s) that cannot be performed in a standard or lightweight manual wheelchair  []  daily use _________hours  []  chair fits throughout home  []  willing and motivated to use  []  prevent repetitive use injuries   []  lower seat height required to foot propel  []  short stature    [x]  Ultra-lightweight manual wheelchair  K0005     Arm:    [x]  both []  right  []  left     Foot:   []  both []  right  []  left       []  hemi height required  []  heavy duty    Front seat to floor __18___ inches      Rear seat to floor __16___ inches      Back height __12___ inches     Back angle ______ degrees      Front angle _____ degrees  [x]   full-time manual wheelchair user  [x]  Requires individualized fitting and optimal adjustments for multiple features that include adjustable axle  configuration, fully adjustable center of gravity, wheel camber, seat and back angle, angle of seat slope, which cannot be accommodated by a K0001 through K0004 manual wheelchair  [x]  prevent repetitive use injuries  [x]  daily use___16______hours   [x]  user has high activity patterns that frequently require  them  to go out into the community for the purpose of independently accomplishing high level MRADL activities. Examples of these might include a combination of; shopping, work, school, Photographer, childcare, independently loading and unloading from a vehicle etc.  []  lower seat height required to foot propel  []  short stature  []  heavy duty -  weight over 250lbs   [x]  Current chair is a K0005   manufacture:____Motion Composite_______________  model:________Apex C_________  serial#____________________  age:___2-3 years old______      What features of the X9994 w/c are needed as compared to the K0004 base? Why?  Ms. Layne has incomplete paraplegia and she is a full-time wheelchair user. Based on her level of injury and resulting impairments she needs a chair that is custom fit to her to provide back and trunk support. Additionally, as a full-time wheelchair user she needs the chair to be custom fit to her size for optimal propulsion that is energy-efficient and decreases the strain and wear and tear on her shoulder joints over time.    [x]  adjustable seat and back angle changes the angle of seat slope of the frame to attain a gravity assisted position for efficient propulsion and proper weight distribution along the frame     [x]  the front of the wheelchair will be configured higher than the back of the chair to allow gravity to assist the user with postural stability  [x]  the center of the wheel will be positioned for stability, safety and efficient propulsion  [x]  adjustable axle allows for vertical, horizontal, camber and overall width changes  throughout the wheels for adjustment of the  client's exact needs and abilities.   [x]  adjustable axle increases the stability and function of the chair allowing for adjustment of the center of gravity.   [x]  accommodates the client's anatomical position in the chair maximizing independence in mobility and maneuverability in all environments.   []  create a minimal fixed tilt-in space to assist in positioning.   [x]  Describe users full-time manual wheelchair activity patterns:  Patient will utilize her manual wheelchair to perform all of her functional  mobility in the home. It will allow her to travel between rooms of her home safely and efficiently in order to complete all of her MRADLs. Pt is able to perform lateral scoot transfers mod I with no AD other than her wheelchair needed. Due to the nature of her diagnosis (incomplete paraplegia) she is a full-time wheelchair user who relies on her manual wheelchair for all of her mobility. She requires an Warehouse manager wheelchair to prevent wear and tear on her shoulders over time and to decrease her risk for further injury.     []  Power assist Comments:  []  prevent repetitive use injuries  []  repetitive strain injury present in    shoulder girdle    []  shoulder pain is (> or =) to 7/10     during manual propulsion       Current Pain _____/10  []  requires conservation of energy to participate in MRADL(s) runable to propel up ramps or curbs using manual wheelchair  []  been K0005 user greater than one year  []  user unwilling to use power      wheelchair (reason): []  less expensive option to power   wheelchair   []  rim activated power assist -      decreased strength   []  Heavy duty manual wheelchair       K0006     Arm:    []  both []  right  []  left     Foot:   []  both []  right  []  left     []  hemi height required    []  Dependent base  []  user exceeds 250lbs  []  non-functional ambulator    []  extreme spasticity  []  over active movement   []  broken frame/hx of repeated     repairs  []  able  to self-propel in residence       []  lower seat to floor height required  []  unable to self-propel in residence   []  Extra heavy duty manual wheelchair  K0007     Arm:    []  both []  right  []  left     Foot:   []  both []  right  []  left     []  hemi height required  []  Dependent base  []  user exceeds 300lbs  []  non-functional ambulator    []  able to self-propel in residence   []  lower seat to floor height required  []  unable to self-propel in residence     []  Manual wheelchair with tilt [phone]      (Manual "Tilt-n-Space")  []  patient is dependent for transfers  []  patient requires frequent       positioning for pressure relief   []  patient requires frequent      positioning for poor/absent trunk control        []  Stroller Base  []  infant/child   []  unable to propel manual      wheelchair  []  allows for growth  []  non-functional ambulator  []  non-functional UE  []  independent mobility is not a goal at this time    MANUAL FRAME OPTIONS      Push handles  []  extended   []  angle adjustable   []  standard  []  caregiver access  []  caregiver assist    []  allows "hooking" to enable      increased ability to perform ADLs or maintain balance   []  Angle Adjustable Back  []  postural control  []  control of tone/spasticity  []  accommodation of range of motion  []   UE functional control  []  accommodation for seating system    Rear wheel placement  []  std/fixed  [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   [x]  camber ___2_____degree  [x]  removable rear wheel  []  non-removable rear wheel  Wheel size __24_____  Wheel style______spoke_________________  [x]  improved UE access to wheels  [x]  increase propulsion ability  []  improved stability  [x]  changing angle in space for      improvement of postural stability  [x]  remove for transport    [x]  allow for seating system to fit on  base  []  amputee placement  []  1-arm drive access   r R  r L  []  enable propulsion of manual       wheelchair with one arm     []  amputee placement   Wheel rims/ Hand rims  [x]  Standard    []  Specialized-____ [x]  provide ability to propel manual   []  increase self-propulsion with hand wheelchair weakness/decreased grasp     []  Spoke protector/guard   []  prevent hands from getting caught in spokes   Tires:  [x]  pneumatic  []  flat free inserts  []  solid  Style:  []  decrease roll resistance              [x]  prevent frequent flats  []  increase shock absorbency  [x]  decrease maintenance   []  decrease pain from road shock    []  decrease spasms from road shock    Wheel Locks:    []  push []  pull [x]  scissor  [x]  lock wheels for transfers  [x]  lock wheels from rolling   Brake/wheel lock extension:  []  R  []  L  []  allow user to operate wheel locks due to decreased reach or strength   Caster housing:  Materials engineer size:                      Style:                                          []  suspension fork  []  maneuverability   []  stability of wheelchair   []  durability  []  maintenance  []  angle adjustment for posture  []  allow for feet to come under        wheelchair base  []  allows change in seat to floor  height   []  increase shock absorbency  []  decrease pain from road shock  []  decrease spasms from road    shock   [x]  Side guards  [x]  prevent clothing getting caught in wheel or becoming soiled   [] provide hip and pelvic stability  [x]  eliminates contact between body and wheels  [x]  limit hand contact with wheels   []  Anti-tippers      []  prevent wheelchair from tipping    backward  []  assist caregiver with curbs       CHAIR OPTIONS MANUAL & POWER      Armrests   []  adjustable height []  removable  []  swing away []  fixed  []  flip back  []  reclining  []  full length pads []  desk []  tube arms []  gel pads  []  provide support with elbow at 90    []  remove/flip back/swing away for  transfers  []  provide support and positioning of upper body    []  allow to come closer to table top  []  remove for access to  tables  []  provide  support for w/c tray  []  change of height/angles for variable activities   []  Elbow support / Elbow stop  []  keep elbow positioned on arm pad  []  keep arms from falling off arm pad  during tilt and/or recline   Upper Extremity Support  []  Arm trough  []   R  []   L  Style:  []  swivel mount []  fixed mount   []  posterior hand support  []   tray  []  full tray  []  joystick cut out  []   R  []   L  Style:  []  decrease gravitational pull on      shoulders  []  provide support to increase UE  function  []  provide hand support in natural    position  []  position flaccid UE  []  decrease subluxation    []  decrease edema       []  manage spasticity   []  provide midline positioning  []  provide work surface  []  placement for AAC/ Computer/ EADL       Hangers/ Legrests   []  ______ degree  []  Elevating []  articulating  []  swing away []  fixed []  lift off  []  heavy duty  []  adjustable knee angle  []  adjustable calf panel   []  longer extension tube              []  provide LE support  []  maintain placement of feet on      footplate   []  accommodate lower leg length  []  accommodate to hamstring       tightness  []  enable transfers  []  provide change in position for LE's  []  elevate legs during recline    []  decrease edema  []  durability      Foot support   [x]  footplate []  R []  L []  flip up           []  Depth adjustable   [x]  angle adjustable  [x]  foot board/one piece    [x]  non-skid adhesive patch [x]  provide foot support  [x]  accommodate to ankle ROM  [x]  allow foot to go under wheelchair base  [x]  enable transfers     []  Shoe holders  []  position foot    []  decrease / manage spasticity  []  control position of LE  []  stability    []  safety     [x]  Ankle strap/heel      loops -calf strap []  support foot on foot support  [x]  decrease extraneous movement  []  provide input to heel   []  protect foot     []  Amputee adapter []  R  []  L     Style:                  Size:   []  Provide support for stump/residual extremity    []  Transportation tie-down  []  to provide crash tested tie-down brackets    []  Crutch/cane holder    []  O2 holder    []  IV hanger   []  Ventilator tray/mount    []  stabilize accessory on wheelchair       Component  Justification     [x]  Seat cushion     [employer] []  accommodate impaired sensation  []  decubitus ulcers present or history  []  unable to shift weight  [x]  increase pressure distribution  [x]  prevent pelvic extension  [x]  custom required "off-the-shelf"    seat cushion will not accommodate deformity  [x]  stabilize/promote pelvis alignment  []  stabilize/promote femur alignment  []   accommodate obliquity  []  accommodate multiple deformity  []  incontinent/accidents  [x]  low maintenance     []  seat mounts                 []  fixed []  removable  []  attach seat platform/cushion to wheelchair frame    []  Seat wedge    []  provide increased aggressiveness of seat shape to decrease sliding  down in the seat  []  accommodate ROM        []  Cover replacement   []  protect back or seat cushion  []  incontinent/accidents    []  Solid seat / insert    []  support cushion to prevent      hammocking  []  allows attachment of cushion to mobility base    []  Lateral pelvic/thigh/hip     support (Guides)     []  decrease abduction  []  accommodate pelvis  []  position upper legs  []  accommodate spasticity  []  removable for transfers     []  Lateral pelvic/thigh      supports mounts  []  fixed   []  swing-away   []  removable  []  mounts lateral pelvic/thigh supports     []  mounts lateral pelvic/thigh supports swing-away or removable for transfers    []  Medial thigh support (Pommel)  [] decrease adduction  [] accommodate ROM  []  remove for transfers   []  alignment      []  Medial thigh   []  fixed      support mounts      []  swing-away   []  removable  []  mounts medial thigh supports   []  Mounts medial supports swing- away or removable for  transfers       Component  Justification   [x]  Back    Comfort Acta Relief   [x]  provide posterior trunk support []  facilitate tone  [x]  provide lumbar/sacral support []  accommodate deformity  []  support trunk in midline   [x]  custom required "off-the-shelf" back support will not accommodate deformity   []  provide lateral trunk support []  accommodate or decrease tone            [x]  Back mounts  [x]  fixed  []  removable  [x]  attach back rest/cushion to wheelchair frame   []  Lateral trunk      supports  []  R []  L  []  decrease lateral trunk leaning  []  accommodate asymmetry    []  contour for increased contact  []  safety    []  control of tone    []  Lateral trunk      supports mounts  []  fixed  []  swing-away   []  removable  []  mounts lateral trunk supports     []  Mounts lateral trunk supports swing-away or removable for transfers   []  Anterior chest      strap, vest     []  decrease forward movement of shoulder  []  decrease forward movement of trunk  []  safety/stability  []  added abdominal support  []  trunk alignment  []  assistance with shoulder control   []  decrease shoulder elevation    []  Headrest      []  provide posterior head support  []  provide posterior neck support  []  provide lateral head support  []  provide anterior head support  []  support during tilt and recline  []  improve feeding     []  improve respiration  []  placement of switches  []  safety    []  accommodate ROM   []  accommodate tone  []  improve visual orientation   []  Headrest           []   fixed []  removable []  flip down      Mounting hardware   []  swing-away laterals/switches  []  mount headrest   []  mounts headrest flip down or  removable for transfers  []  mount headrest swing-away laterals   []  mount switches     []  Neck Support    []  decrease neck rotation  []  decrease forward neck flexion   Pelvic Positioner    []  std hip belt          []  padded hip belt  []  dual pull hip belt  []  four point hip  belt  []  stabilize tone  []  decrease falling out of chair  []  prevent excessive extension  []  special pull angle to control      rotation  []  pad for protection over boney   prominence  []  promote comfort    []  Essential needs        bag/pouch   []  medicines []  special food rorthotics []  clothing changes  []  diapers  []  catheter/hygiene []  ostomy supplies   The above equipment has a life- long use expectancy.  Growth and changes in medical and/or functional conditions would be the exceptions.   SUMMARY:  Why mobility device was selected; include why a lower level device is not appropriate: Patient requires use of a manual wheelchair in order to perform safe and independent mobility in her home and in order to perform her MRADLs in a timely manner. 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 She requires a Location manager wheelchair as she is unable to safely and efficiently propel herself in a K0004 manual wheelchair without putting significant strain on her shoulder joints and putting herself at risk for injury.   ASSESSMENT:  CLINICAL IMPRESSION: Patient is a 48 y.o. female who was seen today for physical therapy evaluation for a new manual wheelchair.   OBJECTIVE IMPAIRMENTS decreased mobility, difficulty walking, decreased ROM, decreased strength, postural dysfunction, and pain.   ACTIVITY LIMITATIONS carrying, lifting, bending, standing, squatting, and stairs   CLINICAL DECISION MAKING: Unstable/unpredictable  EVALUATION COMPLEXITY: High                                   GOALS: One time visit. No goals established.    PLAN: PT FREQUENCY: one time visit    Waddell Southgate, PT Waddell Southgate, PT, DPT, CSRS  10/23/2023, 3:32 PM    I concur with the above findings and recommendations of the therapist:  Physician name printed:         Physician's signature:      Date:
